# Patient Record
Sex: Male | Born: 1945 | Race: White | Smoking: Current every day smoker
Health system: Southern US, Community
[De-identification: ages and names within clinical notes are randomized; demographics above are authoritative.]

## PROBLEM LIST (undated history)

## (undated) DIAGNOSIS — L720 Epidermal cyst: Secondary | ICD-10-CM

## (undated) DIAGNOSIS — I10 Essential (primary) hypertension: Secondary | ICD-10-CM

## (undated) DIAGNOSIS — E039 Hypothyroidism, unspecified: Secondary | ICD-10-CM

## (undated) DIAGNOSIS — M1990 Unspecified osteoarthritis, unspecified site: Secondary | ICD-10-CM

## (undated) HISTORY — PX: TONSILLECTOMY: SUR1361

---

## 2011-02-23 ENCOUNTER — Other Ambulatory Visit: Payer: Self-pay | Admitting: Family Medicine

## 2011-02-23 DIAGNOSIS — G459 Transient cerebral ischemic attack, unspecified: Secondary | ICD-10-CM

## 2011-03-02 ENCOUNTER — Ambulatory Visit
Admission: RE | Admit: 2011-03-02 | Discharge: 2011-03-02 | Disposition: A | Discharge status: Home / Self Care | Payer: BC Managed Care – PPO | Source: Ambulatory Visit | Attending: Family Medicine | Admitting: Family Medicine

## 2011-03-02 ENCOUNTER — Other Ambulatory Visit: Payer: Self-pay

## 2011-03-02 DIAGNOSIS — G459 Transient cerebral ischemic attack, unspecified: Secondary | ICD-10-CM

## 2011-03-13 ENCOUNTER — Other Ambulatory Visit: Payer: Self-pay | Admitting: Neurology

## 2011-03-13 DIAGNOSIS — G459 Transient cerebral ischemic attack, unspecified: Secondary | ICD-10-CM

## 2011-03-16 ENCOUNTER — Ambulatory Visit
Admission: RE | Admit: 2011-03-16 | Discharge: 2011-03-16 | Disposition: A | Discharge status: Home / Self Care | Payer: BC Managed Care – PPO | Source: Ambulatory Visit | Attending: Neurology | Admitting: Neurology

## 2011-03-16 ENCOUNTER — Inpatient Hospital Stay: Admission: RE | Admit: 2011-03-16 | Payer: BC Managed Care – PPO | Source: Ambulatory Visit

## 2011-03-16 DIAGNOSIS — G459 Transient cerebral ischemic attack, unspecified: Secondary | ICD-10-CM

## 2013-07-21 DIAGNOSIS — I10 Essential (primary) hypertension: Secondary | ICD-10-CM | POA: Diagnosis not present

## 2013-08-30 ENCOUNTER — Other Ambulatory Visit: Payer: Self-pay | Admitting: Family Medicine

## 2013-08-30 DIAGNOSIS — Z125 Encounter for screening for malignant neoplasm of prostate: Secondary | ICD-10-CM | POA: Diagnosis not present

## 2013-08-30 DIAGNOSIS — Z0184 Encounter for antibody response examination: Secondary | ICD-10-CM | POA: Diagnosis not present

## 2013-08-30 DIAGNOSIS — Z23 Encounter for immunization: Secondary | ICD-10-CM | POA: Diagnosis not present

## 2013-08-30 DIAGNOSIS — F17200 Nicotine dependence, unspecified, uncomplicated: Secondary | ICD-10-CM

## 2013-08-30 DIAGNOSIS — IMO0001 Reserved for inherently not codable concepts without codable children: Secondary | ICD-10-CM

## 2013-08-30 DIAGNOSIS — I10 Essential (primary) hypertension: Secondary | ICD-10-CM | POA: Diagnosis not present

## 2013-08-30 DIAGNOSIS — R61 Generalized hyperhidrosis: Secondary | ICD-10-CM | POA: Diagnosis not present

## 2013-08-30 DIAGNOSIS — Z0000 Encounter for general adult medical examination without abnormal findings: Secondary | ICD-10-CM | POA: Diagnosis not present

## 2013-09-07 ENCOUNTER — Ambulatory Visit
Admission: RE | Admit: 2013-09-07 | Discharge: 2013-09-07 | Disposition: A | Discharge status: Home / Self Care | Payer: Medicare Other | Source: Ambulatory Visit | Attending: Family Medicine | Admitting: Family Medicine

## 2013-09-07 DIAGNOSIS — F17200 Nicotine dependence, unspecified, uncomplicated: Secondary | ICD-10-CM

## 2013-09-07 DIAGNOSIS — IMO0001 Reserved for inherently not codable concepts without codable children: Secondary | ICD-10-CM

## 2013-09-07 DIAGNOSIS — Z136 Encounter for screening for cardiovascular disorders: Secondary | ICD-10-CM | POA: Diagnosis not present

## 2013-10-12 DIAGNOSIS — I10 Essential (primary) hypertension: Secondary | ICD-10-CM | POA: Diagnosis not present

## 2013-10-12 DIAGNOSIS — IMO0002 Reserved for concepts with insufficient information to code with codable children: Secondary | ICD-10-CM | POA: Diagnosis not present

## 2013-12-18 DIAGNOSIS — D128 Benign neoplasm of rectum: Secondary | ICD-10-CM | POA: Diagnosis not present

## 2013-12-18 DIAGNOSIS — D126 Benign neoplasm of colon, unspecified: Secondary | ICD-10-CM | POA: Diagnosis not present

## 2013-12-18 DIAGNOSIS — D129 Benign neoplasm of anus and anal canal: Secondary | ICD-10-CM | POA: Diagnosis not present

## 2013-12-18 DIAGNOSIS — K5730 Diverticulosis of large intestine without perforation or abscess without bleeding: Secondary | ICD-10-CM | POA: Diagnosis not present

## 2013-12-18 DIAGNOSIS — Z1211 Encounter for screening for malignant neoplasm of colon: Secondary | ICD-10-CM | POA: Diagnosis not present

## 2013-12-18 DIAGNOSIS — K648 Other hemorrhoids: Secondary | ICD-10-CM | POA: Diagnosis not present

## 2014-02-13 DIAGNOSIS — I10 Essential (primary) hypertension: Secondary | ICD-10-CM | POA: Diagnosis not present

## 2014-05-25 DIAGNOSIS — Z23 Encounter for immunization: Secondary | ICD-10-CM | POA: Diagnosis not present

## 2014-09-03 DIAGNOSIS — I10 Essential (primary) hypertension: Secondary | ICD-10-CM | POA: Diagnosis not present

## 2014-09-03 DIAGNOSIS — Z125 Encounter for screening for malignant neoplasm of prostate: Secondary | ICD-10-CM | POA: Diagnosis not present

## 2014-09-03 DIAGNOSIS — Z23 Encounter for immunization: Secondary | ICD-10-CM | POA: Diagnosis not present

## 2014-09-03 DIAGNOSIS — Z0000 Encounter for general adult medical examination without abnormal findings: Secondary | ICD-10-CM | POA: Diagnosis not present

## 2014-11-27 DIAGNOSIS — M170 Bilateral primary osteoarthritis of knee: Secondary | ICD-10-CM | POA: Diagnosis not present

## 2014-11-27 DIAGNOSIS — M1711 Unilateral primary osteoarthritis, right knee: Secondary | ICD-10-CM | POA: Diagnosis not present

## 2015-02-07 DIAGNOSIS — M79604 Pain in right leg: Secondary | ICD-10-CM | POA: Diagnosis not present

## 2015-02-07 DIAGNOSIS — I83891 Varicose veins of right lower extremities with other complications: Secondary | ICD-10-CM | POA: Diagnosis not present

## 2015-02-07 DIAGNOSIS — I83811 Varicose veins of right lower extremities with pain: Secondary | ICD-10-CM | POA: Diagnosis not present

## 2015-02-14 DIAGNOSIS — I8312 Varicose veins of left lower extremity with inflammation: Secondary | ICD-10-CM | POA: Diagnosis not present

## 2015-02-14 DIAGNOSIS — I8311 Varicose veins of right lower extremity with inflammation: Secondary | ICD-10-CM | POA: Diagnosis not present

## 2015-02-21 DIAGNOSIS — I8311 Varicose veins of right lower extremity with inflammation: Secondary | ICD-10-CM | POA: Diagnosis not present

## 2015-02-21 DIAGNOSIS — I8312 Varicose veins of left lower extremity with inflammation: Secondary | ICD-10-CM | POA: Diagnosis not present

## 2015-02-21 DIAGNOSIS — I83813 Varicose veins of bilateral lower extremities with pain: Secondary | ICD-10-CM | POA: Diagnosis not present

## 2015-03-04 DIAGNOSIS — R202 Paresthesia of skin: Secondary | ICD-10-CM | POA: Diagnosis not present

## 2015-03-08 DIAGNOSIS — L089 Local infection of the skin and subcutaneous tissue, unspecified: Secondary | ICD-10-CM | POA: Diagnosis not present

## 2015-03-08 DIAGNOSIS — L723 Sebaceous cyst: Secondary | ICD-10-CM | POA: Diagnosis not present

## 2015-03-14 DIAGNOSIS — R202 Paresthesia of skin: Secondary | ICD-10-CM | POA: Diagnosis not present

## 2015-03-26 ENCOUNTER — Ambulatory Visit: Payer: Self-pay | Admitting: Surgery

## 2015-03-26 DIAGNOSIS — L089 Local infection of the skin and subcutaneous tissue, unspecified: Secondary | ICD-10-CM | POA: Diagnosis not present

## 2015-03-26 DIAGNOSIS — L723 Sebaceous cyst: Secondary | ICD-10-CM | POA: Diagnosis not present

## 2015-04-01 ENCOUNTER — Ambulatory Visit (INDEPENDENT_AMBULATORY_CARE_PROVIDER_SITE_OTHER): Payer: Self-pay | Admitting: Neurology

## 2015-04-01 ENCOUNTER — Ambulatory Visit (INDEPENDENT_AMBULATORY_CARE_PROVIDER_SITE_OTHER): Payer: Medicare Other | Admitting: Neurology

## 2015-04-01 DIAGNOSIS — R202 Paresthesia of skin: Secondary | ICD-10-CM

## 2015-04-01 DIAGNOSIS — Z0289 Encounter for other administrative examinations: Secondary | ICD-10-CM

## 2015-04-01 NOTE — Progress Notes (Signed)
See procedure note.

## 2015-04-01 NOTE — Procedures (Signed)
[facility]    Provider:  Dr Jaynee Eagles Referring Provider: Vernie Shanks, MD Primary Care Physician:  Anthoney Harada, MD  HPI:  Jack Thompson is a 69 y.o. male here as a referral from Dr. Jacelyn Grip for paresthesias. For 6-8 months, numbness in the soles of both feet with tingling, symmetric. No back pain or radicular symptoms. Recently found out he had hypothyroidism.   Summary: Nerve conduction studies were performed on the bilateral lower extremities.  Bilateral Peroneal and Tibial motor conductions were within normal limits with normal F Wave latencies.  Bilateral Sural and Peroneal sensory conductions were within normal limits Bilateral H Reflex latencies were within normal limits.    EMG needle study was performed on selected right lower extremity muscles. The Vastus Medialis, Anterior Tibialis, Medial Gastrocnemius, Extensor Hallucis Longus and Abductor Hallucis muscles were within normal limits.  Conclusion:  Normal study.  There is no electrophysiologic evidence of polyneuropathy to explain patient's symptoms however a small fiber neuropathy could be responsible for such symptoms and still evade detection by this exam.  [name], MD  [facility] [address] [place], [place] [address]  Phone [phone] Fax [phone]

## 2015-04-01 NOTE — Progress Notes (Signed)
  [facility]    Provider:  Dr Jaynee Eagles Referring Provider: Vernie Shanks, MD Primary Care Physician:  Anthoney Harada, MD  HPI:  Jack Thompson is a 69 y.o. male here as a referral from Dr. Jacelyn Grip for paresthesias. For 6-8 months, numbness in the soles of both feet with tingling, symmetric. No back pain or radicular symptoms. Recently found out he had hypothyroidism.   Summary: Nerve conduction studies were performed on the bilateral lower extremities.  Bilateral Peroneal and Tibial motor conductions were within normal limits with normal F Wave latencies.  Bilateral Sural and Peroneal sensory conductions were within normal limits Bilateral H Reflex latencies were within normal limits.    EMG needle study was performed on selected right lower extremity muscles. The Vastus Medialis, Anterior Tibialis, Medial Gastrocnemius, Extensor Hallucis Longus and Abductor Hallucis muscles were within normal limits.  Conclusion:  Normal study.  There is no electrophysiologic evidence of polyneuropathy to explain patient's symptoms however a small fiber neuropathy could be responsible for such symptoms and still evade detection by this exam.  [name], MD  [facility] [address] [place], [place] [address]  Phone [phone] Fax [phone]

## 2015-04-18 DIAGNOSIS — E038 Other specified hypothyroidism: Secondary | ICD-10-CM | POA: Diagnosis not present

## 2015-04-25 DIAGNOSIS — E038 Other specified hypothyroidism: Secondary | ICD-10-CM | POA: Diagnosis not present

## 2015-04-25 DIAGNOSIS — E063 Autoimmune thyroiditis: Secondary | ICD-10-CM | POA: Diagnosis not present

## 2015-04-26 ENCOUNTER — Encounter (HOSPITAL_BASED_OUTPATIENT_CLINIC_OR_DEPARTMENT_OTHER): Payer: Self-pay | Admitting: *Deleted

## 2015-04-30 ENCOUNTER — Encounter (HOSPITAL_BASED_OUTPATIENT_CLINIC_OR_DEPARTMENT_OTHER)
Admission: RE | Admit: 2015-04-30 | Discharge: 2015-04-30 | Disposition: A | Discharge status: Home / Self Care | Payer: Medicare Other | Source: Ambulatory Visit | Attending: General Surgery | Admitting: General Surgery

## 2015-04-30 DIAGNOSIS — L720 Epidermal cyst: Secondary | ICD-10-CM | POA: Diagnosis not present

## 2015-04-30 DIAGNOSIS — L02214 Cutaneous abscess of groin: Secondary | ICD-10-CM | POA: Diagnosis not present

## 2015-04-30 DIAGNOSIS — Z87891 Personal history of nicotine dependence: Secondary | ICD-10-CM | POA: Diagnosis not present

## 2015-04-30 DIAGNOSIS — I10 Essential (primary) hypertension: Secondary | ICD-10-CM | POA: Diagnosis not present

## 2015-04-30 DIAGNOSIS — L02415 Cutaneous abscess of right lower limb: Secondary | ICD-10-CM | POA: Diagnosis not present

## 2015-04-30 NOTE — Progress Notes (Signed)
Clear ekg by dr Conrad [place]

## 2015-05-01 ENCOUNTER — Encounter (HOSPITAL_BASED_OUTPATIENT_CLINIC_OR_DEPARTMENT_OTHER): Payer: Self-pay | Admitting: Surgery

## 2015-05-01 DIAGNOSIS — L723 Sebaceous cyst: Secondary | ICD-10-CM | POA: Diagnosis present

## 2015-05-01 NOTE — H&P (Signed)
General Surgery [facility]  Jack Thompson DOB: [birth year] Married / Language: English / Race: White Male   History of Present Illness Patient words: seb cyst/ infected groin.  The patient is a 69 year old male who presents with a subcutaneous abscess.  Patient is referred by Dr. Ulanda Edison for evaluation of subcutaneous abscess and multiple sebaceous cyst. Patient has had a lifetime of infected cutaneous lesions requiring surgical attention. Patient now has a quiescent sebaceous cyst on the upper back, a healing lesion on the right medial thigh, and an infected and draining lesion on the left groin region. Patient had seen his primary physician earlier this week and is started on Bactrim DS twice daily. He presents today for surgical assessment and recommendations for management.   Other Problems Arthritis High blood pressure  Past Surgical History Knee Surgery Right. Tonsillectomy Vasectomy  Diagnostic Studies History Colonoscopy 1-5 years ago  Allergies No Known Drug Allergies09/08/2014  Medication History Meloxicam ('15MG'$  Tablet, Oral) Active. Norvasc ('5MG'$  Tablet, Oral DAily) Active. Medications Reconciled  Social History Alcohol use Occasional alcohol use. Caffeine use Coffee. No drug use Tobacco use Former smoker.  Family History Arthritis Father, Mother. Heart Disease Mother.  Review of Systems General Not Present- Appetite Loss, Chills, Fatigue, Fever, Night Sweats, Weight Gain and Weight Loss. Skin Present- New Lesions. Not Present- Change in Wart/Mole, Dryness, Hives, Jaundice, Non-Healing Wounds, Rash and Ulcer. HEENT Not Present- Earache, Hearing Loss, Hoarseness, Nose Bleed, Oral Ulcers, Ringing in the Ears, Seasonal Allergies, Sinus Pain, Sore Throat, Visual Disturbances, Wears glasses/contact lenses and Yellow Eyes. Respiratory Not Present- Bloody sputum, Chronic Cough, Difficulty Breathing, Snoring and  Wheezing. Breast Not Present- Breast Mass, Breast Pain, Nipple Discharge and Skin Changes. Cardiovascular Not Present- Chest Pain, Difficulty Breathing Lying Down, Leg Cramps, Palpitations, Rapid Heart Rate, Shortness of Breath and Swelling of Extremities. Gastrointestinal Not Present- Abdominal Pain, Bloating, Bloody Stool, Change in Bowel Habits, Chronic diarrhea, Constipation, Difficulty Swallowing, Excessive gas, Gets full quickly at meals, Hemorrhoids, Indigestion, Nausea, Rectal Pain and Vomiting. Male Genitourinary Not Present- Blood in Urine, Change in Urinary Stream, Frequency, Impotence, Nocturia, Painful Urination, Urgency and Urine Leakage. Musculoskeletal Not Present- Back Pain, Joint Pain, Joint Stiffness, Muscle Pain, Muscle Weakness and Swelling of Extremities. Neurological Present- Numbness and Tingling. Not Present- Decreased Memory, Fainting, Headaches, Seizures, Tremor, Trouble walking and Weakness. Psychiatric Not Present- Anxiety, Bipolar, Change in Sleep Pattern, Depression, Fearful and Frequent crying. Endocrine Not Present- Cold Intolerance, Excessive Hunger, Hair Changes, Heat Intolerance, Hot flashes and New Diabetes. Hematology Present- Easy Bruising. Not Present- Excessive bleeding, Gland problems, HIV and Persistent Infections.  Vitals Weight: 227 lb Height: 72in Body Surface Area: 2.29 m Body Mass Index: 30.79 kg/m  Temp.: 33F(Temporal)  Pulse: 68 (Regular)  BP: 128/78 (Sitting, Left Arm, Standard)   Physical Exam  General - appears comfortable, no distress; not diaphorectic  HEENT - normocephalic; sclerae clear, gaze conjugate; mucous membranes moist, dentition good; voice normal  Neck - symmetric on extension; no palpable anterior or posterior cervical adenopathy; no palpable masses in the thyroid bed  Chest - clear bilaterally without rhonchi, rales, or wheeze  Cor - regular rhythm with normal rate; no significant murmur  Skin - in the  upper back just to the right of midline is a 1.5 cm sebaceous cyst without signs of infection or inflammation; on the right medial thigh is a violaceous-appearing lesion with a central sinus without fluctuance and only minimal tenderness consistent with a healing abscess; on the  posterior medial left thigh there is an open wound with a 5 mm punctate defect draining per usual and fluid and necrotic debris which is redressed with gauze bandages  Ext - non-tender without significant edema or lymphedema  Neuro - grossly intact; no tremor   Assessment & Plan  SEBACEOUS CYST (L72.3) INFLAMED SEBACEOUS CYST (L72.3) INFECTED SEBACEOUS CYST (L72.3)  Patient presents with a history of multiple sebaceous cysts and abscesses. He currently has a sebaceous cyst on the upper back which is not inflamed or infected. He has a second lesion on the medial right thigh which has recently drained and infection appears to be resolving. There is a third lesion on the posterior medial left thigh which is actively infected, opened, and draining.  At this point the patient does not require incision and drainage. He is taking Bactrim DS twice daily on prescription from his primary care physician.  Patient will begin tub soaks twice daily and cleansing of the skin with antibacterial soap. I've asked him to keep a dressing over the draining wound on the left thigh. This should resolve now that it is open, draining, and the patient is on antibiotics.  Patient will return in 3 weeks to assess his progress. If these lesions have become quiescent, we will consider scheduling him for definitive surgical excision as an outpatient procedure.  Earnstine Regal, MD, [facility] Office: [phone]

## 2015-05-02 ENCOUNTER — Ambulatory Visit (HOSPITAL_BASED_OUTPATIENT_CLINIC_OR_DEPARTMENT_OTHER): Payer: Medicare Other | Admitting: Anesthesiology

## 2015-05-02 ENCOUNTER — Encounter (HOSPITAL_BASED_OUTPATIENT_CLINIC_OR_DEPARTMENT_OTHER)
Admission: RE | Disposition: A | Discharge status: Home / Self Care | Payer: Self-pay | Source: Ambulatory Visit | Attending: Surgery

## 2015-05-02 ENCOUNTER — Ambulatory Visit (HOSPITAL_BASED_OUTPATIENT_CLINIC_OR_DEPARTMENT_OTHER)
Admission: RE | Admit: 2015-05-02 | Discharge: 2015-05-02 | Disposition: A | Discharge status: Home / Self Care | Payer: Medicare Other | Source: Ambulatory Visit | Attending: Surgery | Admitting: Surgery

## 2015-05-02 ENCOUNTER — Encounter (HOSPITAL_BASED_OUTPATIENT_CLINIC_OR_DEPARTMENT_OTHER): Payer: Self-pay | Admitting: Anesthesiology

## 2015-05-02 DIAGNOSIS — L729 Follicular cyst of the skin and subcutaneous tissue, unspecified: Secondary | ICD-10-CM | POA: Diagnosis not present

## 2015-05-02 DIAGNOSIS — I10 Essential (primary) hypertension: Secondary | ICD-10-CM | POA: Insufficient documentation

## 2015-05-02 DIAGNOSIS — L728 Other follicular cysts of the skin and subcutaneous tissue: Secondary | ICD-10-CM | POA: Diagnosis not present

## 2015-05-02 DIAGNOSIS — L02214 Cutaneous abscess of groin: Secondary | ICD-10-CM | POA: Insufficient documentation

## 2015-05-02 DIAGNOSIS — L02415 Cutaneous abscess of right lower limb: Secondary | ICD-10-CM | POA: Insufficient documentation

## 2015-05-02 DIAGNOSIS — D1724 Benign lipomatous neoplasm of skin and subcutaneous tissue of left leg: Secondary | ICD-10-CM | POA: Diagnosis not present

## 2015-05-02 DIAGNOSIS — L723 Sebaceous cyst: Secondary | ICD-10-CM | POA: Diagnosis present

## 2015-05-02 DIAGNOSIS — L720 Epidermal cyst: Secondary | ICD-10-CM | POA: Insufficient documentation

## 2015-05-02 DIAGNOSIS — Z87891 Personal history of nicotine dependence: Secondary | ICD-10-CM | POA: Diagnosis not present

## 2015-05-02 DIAGNOSIS — L089 Local infection of the skin and subcutaneous tissue, unspecified: Secondary | ICD-10-CM | POA: Diagnosis not present

## 2015-05-02 HISTORY — DX: Essential (primary) hypertension: I10

## 2015-05-02 HISTORY — PX: CYST EXCISION: SHX5701

## 2015-05-02 HISTORY — DX: Hypothyroidism, unspecified: E03.9

## 2015-05-02 HISTORY — DX: Epidermal cyst: L72.0

## 2015-05-02 HISTORY — DX: Unspecified osteoarthritis, unspecified site: M19.90

## 2015-05-02 SURGERY — CYST REMOVAL
Anesthesia: General

## 2015-05-02 MED ORDER — CEFAZOLIN SODIUM-DEXTROSE 2-3 GM-% IV SOLR
INTRAVENOUS | Status: AC
  Filled 2015-05-02: qty 50

## 2015-05-02 MED ORDER — SCOPOLAMINE 1 MG/3DAYS TD PT72: 1.0000 | MEDICATED_PATCH | Freq: Once | TRANSDERMAL | Status: DC | PRN

## 2015-05-02 MED ORDER — BUPIVACAINE-EPINEPHRINE (PF) 0.5% -1:200000 IJ SOLN
INTRAMUSCULAR | Status: AC
  Filled 2015-05-02: qty 30

## 2015-05-02 MED ORDER — LACTATED RINGERS IV SOLN
INTRAVENOUS | Status: DC
  Administered 2015-05-02 (×2): via INTRAVENOUS

## 2015-05-02 MED ORDER — LIDOCAINE HCL (CARDIAC) 20 MG/ML IV SOLN
INTRAVENOUS | Status: DC | PRN
  Administered 2015-05-02: 80 mg via INTRAVENOUS

## 2015-05-02 MED ORDER — HYDROCODONE-ACETAMINOPHEN 5-325 MG PO TABS: 1.0000 | ORAL_TABLET | ORAL | Status: DC | PRN

## 2015-05-02 MED ORDER — HYDROMORPHONE HCL 1 MG/ML IJ SOLN
0.2500 mg | INTRAMUSCULAR | Status: DC | PRN
Start: 2015-05-02 — End: 2015-05-02

## 2015-05-02 MED ORDER — ONDANSETRON HCL 4 MG/2ML IJ SOLN
INTRAMUSCULAR | Status: DC | PRN
  Administered 2015-05-02: 4 mg via INTRAVENOUS

## 2015-05-02 MED ORDER — OXYCODONE HCL 5 MG/5ML PO SOLN: 5.0000 mg | Freq: Once | ORAL | Status: DC | PRN

## 2015-05-02 MED ORDER — LIDOCAINE HCL (CARDIAC) 20 MG/ML IV SOLN
INTRAVENOUS | Status: AC
  Filled 2015-05-02: qty 5

## 2015-05-02 MED ORDER — FENTANYL CITRATE (PF) 100 MCG/2ML IJ SOLN
INTRAMUSCULAR | Status: AC
  Filled 2015-05-02: qty 4

## 2015-05-02 MED ORDER — DEXAMETHASONE SODIUM PHOSPHATE 4 MG/ML IJ SOLN
INTRAMUSCULAR | Status: DC | PRN
  Administered 2015-05-02: 10 mg via INTRAVENOUS

## 2015-05-02 MED ORDER — MIDAZOLAM HCL 2 MG/2ML IJ SOLN
1.0000 mg | INTRAMUSCULAR | Status: DC | PRN
  Administered 2015-05-02: 2 mg via INTRAVENOUS

## 2015-05-02 MED ORDER — MEPERIDINE HCL 25 MG/ML IJ SOLN: 6.2500 mg | INTRAMUSCULAR | Status: DC | PRN

## 2015-05-02 MED ORDER — GLYCOPYRROLATE 0.2 MG/ML IJ SOLN: 0.2000 mg | Freq: Once | INTRAMUSCULAR | Status: DC | PRN

## 2015-05-02 MED ORDER — PROPOFOL 10 MG/ML IV BOLUS
INTRAVENOUS | Status: AC
  Filled 2015-05-02: qty 20

## 2015-05-02 MED ORDER — BUPIVACAINE HCL (PF) 0.5 % IJ SOLN
INTRAMUSCULAR | Status: DC | PRN
  Administered 2015-05-02: 10 mL

## 2015-05-02 MED ORDER — DEXAMETHASONE SODIUM PHOSPHATE 10 MG/ML IJ SOLN
INTRAMUSCULAR | Status: AC
  Filled 2015-05-02: qty 1

## 2015-05-02 MED ORDER — OXYCODONE HCL 5 MG PO TABS: 5.0000 mg | ORAL_TABLET | Freq: Once | ORAL | Status: DC | PRN

## 2015-05-02 MED ORDER — MIDAZOLAM HCL 2 MG/2ML IJ SOLN
INTRAMUSCULAR | Status: AC
  Filled 2015-05-02: qty 4

## 2015-05-02 MED ORDER — FENTANYL CITRATE (PF) 100 MCG/2ML IJ SOLN
50.0000 ug | INTRAMUSCULAR | Status: AC | PRN
  Administered 2015-05-02 (×2): 50 ug via INTRAVENOUS
  Administered 2015-05-02: 100 ug via INTRAVENOUS

## 2015-05-02 MED ORDER — ONDANSETRON HCL 4 MG/2ML IJ SOLN
INTRAMUSCULAR | Status: AC
  Filled 2015-05-02: qty 2

## 2015-05-02 MED ORDER — PROPOFOL 10 MG/ML IV BOLUS
INTRAVENOUS | Status: DC | PRN
  Administered 2015-05-02: 200 mg via INTRAVENOUS

## 2015-05-02 MED ORDER — CEFAZOLIN SODIUM-DEXTROSE 2-3 GM-% IV SOLR
2.0000 g | INTRAVENOUS | Status: AC
  Administered 2015-05-02: 2 g via INTRAVENOUS

## 2015-05-02 SURGICAL SUPPLY — 43 items
BENZOIN TINCTURE PRP APPL 2/3 (GAUZE/BANDAGES/DRESSINGS) ×4 IMPLANT
BLADE SURG 15 STRL LF DISP TIS (BLADE) ×1 IMPLANT
BLADE SURG 15 STRL SS (BLADE) ×1
BNDG COHESIVE 4X5 TAN STRL (GAUZE/BANDAGES/DRESSINGS) IMPLANT
BNDG GAUZE ELAST 4 BULKY (GAUZE/BANDAGES/DRESSINGS) IMPLANT
CHLORAPREP W/TINT 26ML (MISCELLANEOUS) ×2 IMPLANT
CLEANER CAUTERY TIP 5X5 PAD (MISCELLANEOUS) IMPLANT
COVER BACK TABLE 60X90IN (DRAPES) IMPLANT
COVER MAYO STAND STRL (DRAPES) ×2 IMPLANT
DECANTER SPIKE VIAL GLASS SM (MISCELLANEOUS) IMPLANT
DRAPE EXTREMITY T 121X128X90 (DRAPE) IMPLANT
DRAPE LAPAROTOMY 100X72 PEDS (DRAPES) IMPLANT
DRAPE U-SHAPE 76X120 STRL (DRAPES) IMPLANT
DRAPE UTILITY XL STRL (DRAPES) ×4 IMPLANT
DRSG TEGADERM 4X4.75 (GAUZE/BANDAGES/DRESSINGS) IMPLANT
ELECT REM PT RETURN 9FT ADLT (ELECTROSURGICAL) ×2
ELECTRODE REM PT RTRN 9FT ADLT (ELECTROSURGICAL) ×1 IMPLANT
GLOVE BIO SURGEON STRL SZ 6.5 (GLOVE) ×2 IMPLANT
GLOVE BIOGEL PI IND STRL 7.0 (GLOVE) ×1 IMPLANT
GLOVE BIOGEL PI INDICATOR 7.0 (GLOVE) ×1
GLOVE SURG ORTHO 8.0 STRL STRW (GLOVE) ×2 IMPLANT
GOWN STRL REUS W/ TWL LRG LVL3 (GOWN DISPOSABLE) ×1 IMPLANT
GOWN STRL REUS W/ TWL XL LVL3 (GOWN DISPOSABLE) ×1 IMPLANT
GOWN STRL REUS W/TWL LRG LVL3 (GOWN DISPOSABLE) ×1
GOWN STRL REUS W/TWL XL LVL3 (GOWN DISPOSABLE) ×1
NEEDLE HYPO 25X1 1.5 SAFETY (NEEDLE) ×2 IMPLANT
PACK BASIN DAY SURGERY FS (CUSTOM PROCEDURE TRAY) ×2 IMPLANT
PACK LITHOTOMY IV (CUSTOM PROCEDURE TRAY) ×2 IMPLANT
PAD CLEANER CAUTERY TIP 5X5 (MISCELLANEOUS)
PENCIL BUTTON HOLSTER BLD 10FT (ELECTRODE) ×2 IMPLANT
SHEET MEDIUM DRAPE 40X70 STRL (DRAPES) IMPLANT
SPONGE GAUZE 4X4 12PLY STER LF (GAUZE/BANDAGES/DRESSINGS) ×4 IMPLANT
STOCKINETTE 4X48 STRL (DRAPES) IMPLANT
STRIP CLOSURE SKIN 1/2X4 (GAUZE/BANDAGES/DRESSINGS) ×2 IMPLANT
SUT ETHILON 3 0 PS 1 (SUTURE) IMPLANT
SUT MON AB 4-0 PC3 18 (SUTURE) ×6 IMPLANT
SUT VICRYL 3-0 CR8 SH (SUTURE) IMPLANT
SUT VICRYL 4-0 PS2 18IN ABS (SUTURE) IMPLANT
SYR CONTROL 10ML LL (SYRINGE) ×2 IMPLANT
TOWEL OR 17X24 6PK STRL BLUE (TOWEL DISPOSABLE) ×4 IMPLANT
TOWEL OR NON WOVEN STRL DISP B (DISPOSABLE) IMPLANT
TUBE CONNECTING 20X1/4 (TUBING) ×2 IMPLANT
YANKAUER SUCT BULB TIP NO VENT (SUCTIONS) ×2 IMPLANT

## 2015-05-02 NOTE — Brief Op Note (Signed)
05/02/2015  1:05 PM  PATIENT:  Kevin Fenton  69 y.o. male  PRE-OPERATIVE DIAGNOSIS:  Multiple Cysts  POST-OPERATIVE DIAGNOSIS:  Multiple Cysts  PROCEDURE:  Procedure(s): EXCISION OF CYST RIGHT THIGH AND BUTTOCK (N/A)  SURGEON:  Surgeon(s) and Role:    * Armandina Gemma, MD - Primary  ANESTHESIA:   general  EBL:  Total I/O In: 1000 [I.V.:1000] Out: -   BLOOD ADMINISTERED:none  DRAINS: none   LOCAL MEDICATIONS USED:  MARCAINE     SPECIMEN:  Excision  DISPOSITION OF SPECIMEN:  PATHOLOGY  COUNTS:  YES  TOURNIQUET:  * No tourniquets in log *  DICTATION: .Other Dictation: Dictation Number [id number]  PLAN OF CARE: Discharge to home after PACU  PATIENT DISPOSITION:  PACU - hemodynamically stable.   Delay start of Pharmacological VTE agent (>24hrs) due to surgical blood loss or risk of bleeding: yes  Earnstine Regal, MD, [facility] Office: [phone]

## 2015-05-02 NOTE — Anesthesia Postprocedure Evaluation (Signed)
  Anesthesia Post-op Note  Patient: Jack Thompson  Procedure(s) Performed: Procedure(s): EXCISION OF CYST RIGHT THIGH AND BUTTOCK (N/A)  Patient Location: PACU  Anesthesia Type: General   Level of Consciousness: awake, alert  and oriented  Airway and Oxygen Therapy: Patient Spontanous Breathing  Post-op Pain: mild  Post-op Assessment: Post-op Vital signs reviewed  Post-op Vital Signs: Reviewed  Last Vitals:  Filed Vitals:   05/02/15 1415  BP: 159/83  Pulse: 57  Temp: 36.4 C  Resp: 16    Complications: No apparent anesthesia complications

## 2015-05-02 NOTE — Anesthesia Postprocedure Evaluation (Signed)
  Anesthesia Post-op Note  Patient: Jack Thompson  Procedure(s) Performed: Procedure(s): EXCISION OF CYST RIGHT THIGH AND BUTTOCK (N/A)  Patient Location: PACU  Anesthesia Type: General   Level of Consciousness: awake, alert  and oriented  Airway and Oxygen Therapy: Patient Spontanous Breathing  Post-op Pain: mild  Post-op Assessment: Post-op Vital signs reviewed  Post-op Vital Signs: Reviewed  Last Vitals:  Filed Vitals:   05/02/15 1341  BP:   Pulse: 57  Temp:   Resp: 15    Complications: No apparent anesthesia complications

## 2015-05-02 NOTE — Discharge Instructions (Signed)
°  Post Anesthesia Home Care Instructions  Activity: Get plenty of rest for the remainder of the day. A responsible adult should stay with you for 24 hours following the procedure.  For the next 24 hours, DO NOT: -Drive a car -Paediatric nurse -Drink alcoholic beverages -Take any medication unless instructed by your physician -Make any legal decisions or sign important papers.  Meals: Start with liquid foods such as gelatin or soup. Progress to regular foods as tolerated. Avoid greasy, spicy, heavy foods. If nausea and/or vomiting occur, drink only clear liquids until the nausea and/or vomiting subsides. Call your physician if vomiting continues.  Special Instructions/Symptoms: Your throat may feel dry or sore from the anesthesia or the breathing tube placed in your throat during surgery. If this causes discomfort, gargle with warm salt water. The discomfort should disappear within 24 hours.  If you had a scopolamine patch placed behind your ear for the management of post- operative nausea and/or vomiting:  1. The medication in the patch is effective for 72 hours, after which it should be removed.  Wrap patch in a tissue and discard in the trash. Wash hands thoroughly with soap and water. 2. You may remove the patch earlier than 72 hours if you experience unpleasant side effects which may include dry mouth, dizziness or visual disturbances. 3. Avoid touching the patch. Wash your hands with soap and water after contact with the patch.   Call your surgeon if you experience:   1.  Fever over 101.0. 2.  Inability to urinate. 3.  Nausea and/or vomiting. 4.  Extreme swelling or bruising at the surgical site. 5.  Continued bleeding from the incision. 6.  Increased pain, redness or drainage from the incision. 7.  Problems related to your pain medication. 8. Any change in color, movement and/or sensation 9. Any problems and/or concerns

## 2015-05-02 NOTE — Anesthesia Procedure Notes (Signed)
Procedure Name: LMA Insertion Date/Time: 05/02/2015 12:12 PM Performed by: Maryella Shivers Pre-anesthesia Checklist: Patient identified, Emergency Drugs available, Suction available and Patient being monitored Patient Re-evaluated:Patient Re-evaluated prior to inductionOxygen Delivery Method: Circle System Utilized Preoxygenation: Pre-oxygenation with 100% oxygen Intubation Type: IV induction Ventilation: Mask ventilation without difficulty LMA: LMA inserted LMA Size: 5.0 Number of attempts: 1 Airway Equipment and Method: Bite block Placement Confirmation: positive ETCO2 Tube secured with: Tape Dental Injury: Teeth and Oropharynx as per pre-operative assessment

## 2015-05-02 NOTE — Transfer of Care (Signed)
Immediate Anesthesia Transfer of Care Note  Patient: Jack Thompson  Procedure(s) Performed: Procedure(s): EXCISION OF CYST RIGHT THIGH AND BUTTOCK (N/A)  Patient Location: PACU  Anesthesia Type:General  Level of Consciousness: sedated  Airway & Oxygen Therapy: Patient Spontanous Breathing and Patient connected to face mask oxygen  Post-op Assessment: Report given to RN and Post -op Vital signs reviewed and stable  Post vital signs: Reviewed and stable  Last Vitals:  Filed Vitals:   05/02/15 1144  BP: 138/89  Pulse: 67  Temp: 36.3 C  Resp: 20    Complications: No apparent anesthesia complications

## 2015-05-02 NOTE — Interval H&P Note (Signed)
History and Physical Interval Note:  05/02/2015 11:55 AM  Jack Thompson  has presented today for surgery, with the diagnosis of Multiple Cysts.  The various methods of treatment have been discussed with the patient and family. After consideration of risks, benefits and other options for treatment, the patient has consented to    Procedure(s): EXCISION OF CYST RIGHT THIGH AND BUTTOCK (N/A) as a surgical intervention .    The patient's history has been reviewed, patient examined, no change in status, stable for surgery.  I have reviewed the patient's chart and labs.  Questions were answered to the patient's satisfaction.    Earnstine Regal, MD, [facility] Office: [place]

## 2015-05-02 NOTE — Anesthesia Preprocedure Evaluation (Signed)
Anesthesia Evaluation  Patient identified by MRN, date of birth, ID band Patient awake    Reviewed: Allergy & Precautions, NPO status , Patient's Chart, lab work & pertinent test results  Airway Mallampati: I  TM Distance: >3 FB Neck ROM: Full    Dental  (+) Teeth Intact, Dental Advisory Given   Pulmonary Current Smoker,    breath sounds clear to auscultation       Cardiovascular hypertension, Pt. on medications  Rhythm:Regular Rate:Normal     Neuro/Psych    GI/Hepatic   Endo/Other  Hypothyroidism   Renal/GU      Musculoskeletal   Abdominal   Peds  Hematology   Anesthesia Other Findings   Reproductive/Obstetrics                             Anesthesia Physical Anesthesia Plan  ASA: II  Anesthesia Plan: General   Post-op Pain Management:    Induction: Intravenous  Airway Management Planned: LMA  Additional Equipment:   Intra-op Plan:   Post-operative Plan: Extubation in OR  Informed Consent: I have reviewed the patients History and Physical, chart, labs and discussed the procedure including the risks, benefits and alternatives for the proposed anesthesia with the patient or authorized representative who has indicated his/her understanding and acceptance.   Dental advisory given  Plan Discussed with: CRNA, Anesthesiologist and Surgeon  Anesthesia Plan Comments:         Anesthesia Quick Evaluation

## 2015-05-03 ENCOUNTER — Encounter (HOSPITAL_BASED_OUTPATIENT_CLINIC_OR_DEPARTMENT_OTHER): Payer: Self-pay | Admitting: Surgery

## 2015-05-03 NOTE — Op Note (Signed)
NAMEVINH, SACHS NO.:  [id number]  MEDICAL RECORD NO.:  [id number]  LOCATION:                               FACILITY:  [facility]  PHYSICIAN:  Earnstine Regal, MD      DATE OF BIRTH:  May 17, 1946  DATE OF PROCEDURE:  05/02/2015                              OPERATIVE REPORT   PREOPERATIVE DIAGNOSES: 1. Sebaceous cyst, right medial thigh, with history of abscess. 2. Sebaceous cyst, medial left groin, history of abscess. 3. Soft tissue mass, medial left groin.  POSTOPERATIVE DIAGNOSES: 1. Sebaceous cyst, right medial thigh, with history of abscess. 2. Sebaceous cyst, medial left groin, history of abscess. 3. Soft tissue mass, medial left groin.  PROCEDURES: 1. Excision of sebaceous cyst, right medial thigh (1.5 cm). 2. Excision of sebaceous cyst, left medial groin (1.0 cm). 3. Excision of soft tissue mass, left medial thigh (1.5 cm).  SURGEON:  Earnstine Regal, MD, FACS  ANESTHESIA:  General.  ESTIMATED BLOOD LOSS:  Minimal.  PREPARATION:  ChloraPrep.  COMPLICATIONS:  None.  INDICATIONS:  The patient is a 69 year old male who presented to my practice with inflammation and abscess involving sebaceous cyst on the right medial thigh and left groin.  These were treated in the office. They are now ready for definitive surgical excision.  DESCRIPTION OF PROCEDURE:  Procedure was done in OR #3 at the [facility].  The patient was brought to the operating room, placed in supine position on the operating room table.  Following administration of general anesthesia, the patient was placed in lithotomy and prepped and draped in the usual aseptic fashion.  After ascertaining that an adequate level of anesthesia had been achieved, the lesion on the medial right thigh is excised with an elliptical incision using a #15 blade.  Dissection encompasses both of the sinus tracts and the skin and was extended into the subcutaneous tissue so as to include the  entire cyst wall.  Tissue was submitted to Pathology for review. Local anesthetic was infiltrated.  Hemostasis was achieved with the electrocautery.  Skin edges were reapproximated with a running 4-0 Monocryl subcuticular suture.  Benzoin and Steri-Strips were applied.  Next, we excised the sebaceous cyst in the medial left groin.  Again, an elliptical incision was made with a #15 blade and dissection carried into the subcutaneous tissues so as to encompass the entire cyst wall as well as the sinus tract to the skin.  The cyst measures approximately 1 cm in size and is submitted in its entirety to Pathology for review. Skin edges were anesthetized with local anesthetic and then closed with a running 4-0 Monocryl suture.  Next, the soft tissue mass which is in the medial left thigh is excised with an elliptical incision made with a #15 blade.  Dissection was carried in the subcutaneous tissue, so that the entire mass was excised. Hemostasis was achieved with electrocautery.  Local anesthetic was infiltrated circumferentially.  Skin edges were reapproximated with a running 4-0 Monocryl suture.  Both wounds on the left are washed and dried and benzoin and Steri-Strips were applied.  Sterile dressings were applied to all wounds.  The patient was  awakened from anesthesia and brought to the recovery room.  The patient tolerated the procedure well.   Earnstine Regal, MD, [facility] Office: [phone]    TMG/MEDQ  D:  05/02/2015  T:  05/03/2015  Job:  [id number]

## 2015-05-03 NOTE — Progress Notes (Signed)
Quick Note:  Please contact patient and notify of benign pathology results.  Breely Panik M. Kyngston Pickelsimer, MD, FACS [facility] Office: [phone]   ______ 

## 2015-05-14 DIAGNOSIS — I83811 Varicose veins of right lower extremities with pain: Secondary | ICD-10-CM | POA: Diagnosis not present

## 2015-05-14 DIAGNOSIS — I8311 Varicose veins of right lower extremity with inflammation: Secondary | ICD-10-CM | POA: Diagnosis not present

## 2015-06-18 DIAGNOSIS — E038 Other specified hypothyroidism: Secondary | ICD-10-CM | POA: Diagnosis not present

## 2015-06-20 DIAGNOSIS — E038 Other specified hypothyroidism: Secondary | ICD-10-CM | POA: Diagnosis not present

## 2015-06-20 DIAGNOSIS — E063 Autoimmune thyroiditis: Secondary | ICD-10-CM | POA: Diagnosis not present

## 2015-06-20 DIAGNOSIS — Z23 Encounter for immunization: Secondary | ICD-10-CM | POA: Diagnosis not present

## 2015-08-14 DIAGNOSIS — E038 Other specified hypothyroidism: Secondary | ICD-10-CM | POA: Diagnosis not present

## 2015-08-16 DIAGNOSIS — E038 Other specified hypothyroidism: Secondary | ICD-10-CM | POA: Diagnosis not present

## 2015-08-16 DIAGNOSIS — E063 Autoimmune thyroiditis: Secondary | ICD-10-CM | POA: Diagnosis not present

## 2015-10-01 DIAGNOSIS — Z136 Encounter for screening for cardiovascular disorders: Secondary | ICD-10-CM | POA: Diagnosis not present

## 2015-10-01 DIAGNOSIS — E039 Hypothyroidism, unspecified: Secondary | ICD-10-CM | POA: Diagnosis not present

## 2015-10-01 DIAGNOSIS — R635 Abnormal weight gain: Secondary | ICD-10-CM | POA: Diagnosis not present

## 2015-10-01 DIAGNOSIS — Z87891 Personal history of nicotine dependence: Secondary | ICD-10-CM | POA: Diagnosis not present

## 2015-10-01 DIAGNOSIS — Z125 Encounter for screening for malignant neoplasm of prostate: Secondary | ICD-10-CM | POA: Diagnosis not present

## 2015-10-01 DIAGNOSIS — I10 Essential (primary) hypertension: Secondary | ICD-10-CM | POA: Diagnosis not present

## 2015-10-11 DIAGNOSIS — E038 Other specified hypothyroidism: Secondary | ICD-10-CM | POA: Diagnosis not present

## 2015-10-14 DIAGNOSIS — E038 Other specified hypothyroidism: Secondary | ICD-10-CM | POA: Diagnosis not present

## 2015-10-14 DIAGNOSIS — E063 Autoimmune thyroiditis: Secondary | ICD-10-CM | POA: Diagnosis not present

## 2015-11-07 DIAGNOSIS — M25561 Pain in right knee: Secondary | ICD-10-CM | POA: Diagnosis not present

## 2015-11-07 DIAGNOSIS — M25562 Pain in left knee: Secondary | ICD-10-CM | POA: Diagnosis not present

## 2015-11-07 DIAGNOSIS — M170 Bilateral primary osteoarthritis of knee: Secondary | ICD-10-CM | POA: Diagnosis not present

## 2015-11-14 DIAGNOSIS — R262 Difficulty in walking, not elsewhere classified: Secondary | ICD-10-CM | POA: Diagnosis not present

## 2015-11-14 DIAGNOSIS — M170 Bilateral primary osteoarthritis of knee: Secondary | ICD-10-CM | POA: Diagnosis not present

## 2015-11-14 DIAGNOSIS — M25561 Pain in right knee: Secondary | ICD-10-CM | POA: Diagnosis not present

## 2015-11-14 DIAGNOSIS — R2689 Other abnormalities of gait and mobility: Secondary | ICD-10-CM | POA: Diagnosis not present

## 2015-11-14 DIAGNOSIS — M25562 Pain in left knee: Secondary | ICD-10-CM | POA: Diagnosis not present

## 2015-11-20 DIAGNOSIS — M1712 Unilateral primary osteoarthritis, left knee: Secondary | ICD-10-CM | POA: Diagnosis not present

## 2015-11-20 DIAGNOSIS — M25562 Pain in left knee: Secondary | ICD-10-CM | POA: Diagnosis not present

## 2015-11-20 DIAGNOSIS — M25561 Pain in right knee: Secondary | ICD-10-CM | POA: Diagnosis not present

## 2015-11-20 DIAGNOSIS — R2689 Other abnormalities of gait and mobility: Secondary | ICD-10-CM | POA: Diagnosis not present

## 2015-11-20 DIAGNOSIS — M170 Bilateral primary osteoarthritis of knee: Secondary | ICD-10-CM | POA: Diagnosis not present

## 2015-11-20 DIAGNOSIS — M1711 Unilateral primary osteoarthritis, right knee: Secondary | ICD-10-CM | POA: Diagnosis not present

## 2015-11-21 DIAGNOSIS — M25561 Pain in right knee: Secondary | ICD-10-CM | POA: Diagnosis not present

## 2015-11-21 DIAGNOSIS — M170 Bilateral primary osteoarthritis of knee: Secondary | ICD-10-CM | POA: Diagnosis not present

## 2015-11-21 DIAGNOSIS — R2689 Other abnormalities of gait and mobility: Secondary | ICD-10-CM | POA: Diagnosis not present

## 2015-11-21 DIAGNOSIS — M1711 Unilateral primary osteoarthritis, right knee: Secondary | ICD-10-CM | POA: Diagnosis not present

## 2015-11-21 DIAGNOSIS — M25562 Pain in left knee: Secondary | ICD-10-CM | POA: Diagnosis not present

## 2015-11-26 DIAGNOSIS — M25561 Pain in right knee: Secondary | ICD-10-CM | POA: Diagnosis not present

## 2015-11-26 DIAGNOSIS — M25562 Pain in left knee: Secondary | ICD-10-CM | POA: Diagnosis not present

## 2015-11-26 DIAGNOSIS — M1712 Unilateral primary osteoarthritis, left knee: Secondary | ICD-10-CM | POA: Diagnosis not present

## 2015-11-26 DIAGNOSIS — M170 Bilateral primary osteoarthritis of knee: Secondary | ICD-10-CM | POA: Diagnosis not present

## 2015-11-26 DIAGNOSIS — R2689 Other abnormalities of gait and mobility: Secondary | ICD-10-CM | POA: Diagnosis not present

## 2015-11-28 DIAGNOSIS — R2689 Other abnormalities of gait and mobility: Secondary | ICD-10-CM | POA: Diagnosis not present

## 2015-11-28 DIAGNOSIS — M1711 Unilateral primary osteoarthritis, right knee: Secondary | ICD-10-CM | POA: Diagnosis not present

## 2015-11-28 DIAGNOSIS — M170 Bilateral primary osteoarthritis of knee: Secondary | ICD-10-CM | POA: Diagnosis not present

## 2015-11-28 DIAGNOSIS — M25561 Pain in right knee: Secondary | ICD-10-CM | POA: Diagnosis not present

## 2015-11-28 DIAGNOSIS — M25562 Pain in left knee: Secondary | ICD-10-CM | POA: Diagnosis not present

## 2015-12-04 DIAGNOSIS — M25561 Pain in right knee: Secondary | ICD-10-CM | POA: Diagnosis not present

## 2015-12-04 DIAGNOSIS — M170 Bilateral primary osteoarthritis of knee: Secondary | ICD-10-CM | POA: Diagnosis not present

## 2015-12-04 DIAGNOSIS — M1712 Unilateral primary osteoarthritis, left knee: Secondary | ICD-10-CM | POA: Diagnosis not present

## 2015-12-04 DIAGNOSIS — M25562 Pain in left knee: Secondary | ICD-10-CM | POA: Diagnosis not present

## 2015-12-04 DIAGNOSIS — R2689 Other abnormalities of gait and mobility: Secondary | ICD-10-CM | POA: Diagnosis not present

## 2015-12-05 DIAGNOSIS — M25561 Pain in right knee: Secondary | ICD-10-CM | POA: Diagnosis not present

## 2015-12-05 DIAGNOSIS — M170 Bilateral primary osteoarthritis of knee: Secondary | ICD-10-CM | POA: Diagnosis not present

## 2015-12-05 DIAGNOSIS — M25562 Pain in left knee: Secondary | ICD-10-CM | POA: Diagnosis not present

## 2015-12-05 DIAGNOSIS — R2689 Other abnormalities of gait and mobility: Secondary | ICD-10-CM | POA: Diagnosis not present

## 2015-12-05 DIAGNOSIS — M1711 Unilateral primary osteoarthritis, right knee: Secondary | ICD-10-CM | POA: Diagnosis not present

## 2015-12-10 DIAGNOSIS — E038 Other specified hypothyroidism: Secondary | ICD-10-CM | POA: Diagnosis not present

## 2015-12-11 DIAGNOSIS — M25562 Pain in left knee: Secondary | ICD-10-CM | POA: Diagnosis not present

## 2015-12-11 DIAGNOSIS — M170 Bilateral primary osteoarthritis of knee: Secondary | ICD-10-CM | POA: Diagnosis not present

## 2015-12-11 DIAGNOSIS — M25561 Pain in right knee: Secondary | ICD-10-CM | POA: Diagnosis not present

## 2015-12-12 DIAGNOSIS — E038 Other specified hypothyroidism: Secondary | ICD-10-CM | POA: Diagnosis not present

## 2015-12-12 DIAGNOSIS — E063 Autoimmune thyroiditis: Secondary | ICD-10-CM | POA: Diagnosis not present

## 2016-04-03 DIAGNOSIS — R635 Abnormal weight gain: Secondary | ICD-10-CM | POA: Diagnosis not present

## 2016-04-03 DIAGNOSIS — E7800 Pure hypercholesterolemia, unspecified: Secondary | ICD-10-CM | POA: Diagnosis not present

## 2016-04-03 DIAGNOSIS — Z87891 Personal history of nicotine dependence: Secondary | ICD-10-CM | POA: Diagnosis not present

## 2016-04-03 DIAGNOSIS — E039 Hypothyroidism, unspecified: Secondary | ICD-10-CM | POA: Diagnosis not present

## 2016-04-03 DIAGNOSIS — I10 Essential (primary) hypertension: Secondary | ICD-10-CM | POA: Diagnosis not present

## 2016-04-10 DIAGNOSIS — E038 Other specified hypothyroidism: Secondary | ICD-10-CM | POA: Diagnosis not present

## 2016-04-14 DIAGNOSIS — E063 Autoimmune thyroiditis: Secondary | ICD-10-CM | POA: Diagnosis not present

## 2016-04-14 DIAGNOSIS — Z23 Encounter for immunization: Secondary | ICD-10-CM | POA: Diagnosis not present

## 2016-04-14 DIAGNOSIS — E038 Other specified hypothyroidism: Secondary | ICD-10-CM | POA: Diagnosis not present

## 2016-07-21 DIAGNOSIS — I10 Essential (primary) hypertension: Secondary | ICD-10-CM | POA: Diagnosis not present

## 2016-07-21 DIAGNOSIS — R635 Abnormal weight gain: Secondary | ICD-10-CM | POA: Diagnosis not present

## 2016-07-21 DIAGNOSIS — E7800 Pure hypercholesterolemia, unspecified: Secondary | ICD-10-CM | POA: Diagnosis not present

## 2016-07-21 DIAGNOSIS — E039 Hypothyroidism, unspecified: Secondary | ICD-10-CM | POA: Diagnosis not present

## 2016-10-13 DIAGNOSIS — E039 Hypothyroidism, unspecified: Secondary | ICD-10-CM | POA: Diagnosis not present

## 2016-10-13 DIAGNOSIS — E063 Autoimmune thyroiditis: Secondary | ICD-10-CM | POA: Diagnosis not present

## 2016-10-29 DIAGNOSIS — E063 Autoimmune thyroiditis: Secondary | ICD-10-CM | POA: Diagnosis not present

## 2016-10-29 DIAGNOSIS — F17290 Nicotine dependence, other tobacco product, uncomplicated: Secondary | ICD-10-CM | POA: Diagnosis not present

## 2016-10-29 DIAGNOSIS — E038 Other specified hypothyroidism: Secondary | ICD-10-CM | POA: Diagnosis not present

## 2016-12-24 DIAGNOSIS — Z125 Encounter for screening for malignant neoplasm of prostate: Secondary | ICD-10-CM | POA: Diagnosis not present

## 2016-12-24 DIAGNOSIS — R635 Abnormal weight gain: Secondary | ICD-10-CM | POA: Diagnosis not present

## 2016-12-24 DIAGNOSIS — E7800 Pure hypercholesterolemia, unspecified: Secondary | ICD-10-CM | POA: Diagnosis not present

## 2016-12-24 DIAGNOSIS — I10 Essential (primary) hypertension: Secondary | ICD-10-CM | POA: Diagnosis not present

## 2016-12-24 DIAGNOSIS — E039 Hypothyroidism, unspecified: Secondary | ICD-10-CM | POA: Diagnosis not present

## 2017-04-29 DIAGNOSIS — E063 Autoimmune thyroiditis: Secondary | ICD-10-CM | POA: Diagnosis not present

## 2017-04-29 DIAGNOSIS — E038 Other specified hypothyroidism: Secondary | ICD-10-CM | POA: Diagnosis not present

## 2017-05-04 DIAGNOSIS — E063 Autoimmune thyroiditis: Secondary | ICD-10-CM | POA: Diagnosis not present

## 2017-05-04 DIAGNOSIS — Z23 Encounter for immunization: Secondary | ICD-10-CM | POA: Diagnosis not present

## 2017-05-04 DIAGNOSIS — E038 Other specified hypothyroidism: Secondary | ICD-10-CM | POA: Diagnosis not present

## 2017-05-04 DIAGNOSIS — F17210 Nicotine dependence, cigarettes, uncomplicated: Secondary | ICD-10-CM | POA: Diagnosis not present

## 2017-05-24 DIAGNOSIS — Z1211 Encounter for screening for malignant neoplasm of colon: Secondary | ICD-10-CM | POA: Diagnosis not present

## 2017-10-21 DIAGNOSIS — E038 Other specified hypothyroidism: Secondary | ICD-10-CM | POA: Diagnosis not present

## 2017-10-21 DIAGNOSIS — E063 Autoimmune thyroiditis: Secondary | ICD-10-CM | POA: Diagnosis not present

## 2017-11-02 DIAGNOSIS — R69 Illness, unspecified: Secondary | ICD-10-CM | POA: Diagnosis not present

## 2017-11-02 DIAGNOSIS — E063 Autoimmune thyroiditis: Secondary | ICD-10-CM | POA: Diagnosis not present

## 2017-11-02 DIAGNOSIS — E669 Obesity, unspecified: Secondary | ICD-10-CM | POA: Diagnosis not present

## 2017-11-02 DIAGNOSIS — E038 Other specified hypothyroidism: Secondary | ICD-10-CM | POA: Diagnosis not present

## 2017-11-02 DIAGNOSIS — Z6835 Body mass index (BMI) 35.0-35.9, adult: Secondary | ICD-10-CM | POA: Diagnosis not present

## 2017-11-02 DIAGNOSIS — Z79890 Hormone replacement therapy (postmenopausal): Secondary | ICD-10-CM | POA: Diagnosis not present

## 2017-11-08 DIAGNOSIS — G459 Transient cerebral ischemic attack, unspecified: Secondary | ICD-10-CM | POA: Diagnosis not present

## 2017-11-08 DIAGNOSIS — E039 Hypothyroidism, unspecified: Secondary | ICD-10-CM | POA: Diagnosis not present

## 2017-11-08 DIAGNOSIS — I10 Essential (primary) hypertension: Secondary | ICD-10-CM | POA: Diagnosis not present

## 2017-11-15 DIAGNOSIS — R69 Illness, unspecified: Secondary | ICD-10-CM | POA: Diagnosis not present

## 2018-01-14 DIAGNOSIS — E039 Hypothyroidism, unspecified: Secondary | ICD-10-CM | POA: Diagnosis not present

## 2018-01-14 DIAGNOSIS — Z125 Encounter for screening for malignant neoplasm of prostate: Secondary | ICD-10-CM | POA: Diagnosis not present

## 2018-01-14 DIAGNOSIS — G459 Transient cerebral ischemic attack, unspecified: Secondary | ICD-10-CM | POA: Diagnosis not present

## 2018-01-14 DIAGNOSIS — R739 Hyperglycemia, unspecified: Secondary | ICD-10-CM | POA: Diagnosis not present

## 2018-01-14 DIAGNOSIS — I10 Essential (primary) hypertension: Secondary | ICD-10-CM | POA: Diagnosis not present

## 2018-01-14 DIAGNOSIS — E7800 Pure hypercholesterolemia, unspecified: Secondary | ICD-10-CM | POA: Diagnosis not present

## 2018-04-28 DIAGNOSIS — E038 Other specified hypothyroidism: Secondary | ICD-10-CM | POA: Diagnosis not present

## 2018-04-28 DIAGNOSIS — E063 Autoimmune thyroiditis: Secondary | ICD-10-CM | POA: Diagnosis not present

## 2018-05-03 DIAGNOSIS — E063 Autoimmune thyroiditis: Secondary | ICD-10-CM | POA: Diagnosis not present

## 2018-05-03 DIAGNOSIS — Z87891 Personal history of nicotine dependence: Secondary | ICD-10-CM | POA: Diagnosis not present

## 2018-05-03 DIAGNOSIS — Z23 Encounter for immunization: Secondary | ICD-10-CM | POA: Diagnosis not present

## 2018-05-03 DIAGNOSIS — E038 Other specified hypothyroidism: Secondary | ICD-10-CM | POA: Diagnosis not present

## 2018-06-09 DIAGNOSIS — E7800 Pure hypercholesterolemia, unspecified: Secondary | ICD-10-CM | POA: Diagnosis not present

## 2018-06-09 DIAGNOSIS — E039 Hypothyroidism, unspecified: Secondary | ICD-10-CM | POA: Diagnosis not present

## 2018-06-09 DIAGNOSIS — G459 Transient cerebral ischemic attack, unspecified: Secondary | ICD-10-CM | POA: Diagnosis not present

## 2018-06-09 DIAGNOSIS — I10 Essential (primary) hypertension: Secondary | ICD-10-CM | POA: Diagnosis not present

## 2018-12-09 DIAGNOSIS — R69 Illness, unspecified: Secondary | ICD-10-CM | POA: Diagnosis not present

## 2018-12-09 DIAGNOSIS — G459 Transient cerebral ischemic attack, unspecified: Secondary | ICD-10-CM | POA: Diagnosis not present

## 2018-12-09 DIAGNOSIS — I10 Essential (primary) hypertension: Secondary | ICD-10-CM | POA: Diagnosis not present

## 2018-12-09 DIAGNOSIS — E7800 Pure hypercholesterolemia, unspecified: Secondary | ICD-10-CM | POA: Diagnosis not present

## 2018-12-09 DIAGNOSIS — E039 Hypothyroidism, unspecified: Secondary | ICD-10-CM | POA: Diagnosis not present

## 2019-01-04 DIAGNOSIS — E038 Other specified hypothyroidism: Secondary | ICD-10-CM | POA: Diagnosis not present

## 2019-01-04 DIAGNOSIS — E063 Autoimmune thyroiditis: Secondary | ICD-10-CM | POA: Diagnosis not present

## 2019-01-10 DIAGNOSIS — Z87891 Personal history of nicotine dependence: Secondary | ICD-10-CM | POA: Diagnosis not present

## 2019-01-10 DIAGNOSIS — E669 Obesity, unspecified: Secondary | ICD-10-CM | POA: Diagnosis not present

## 2019-01-10 DIAGNOSIS — E063 Autoimmune thyroiditis: Secondary | ICD-10-CM | POA: Diagnosis not present

## 2019-01-10 DIAGNOSIS — E038 Other specified hypothyroidism: Secondary | ICD-10-CM | POA: Diagnosis not present

## 2019-07-10 DIAGNOSIS — E063 Autoimmune thyroiditis: Secondary | ICD-10-CM | POA: Diagnosis not present

## 2019-07-10 DIAGNOSIS — E038 Other specified hypothyroidism: Secondary | ICD-10-CM | POA: Diagnosis not present

## 2019-07-13 DIAGNOSIS — Z87891 Personal history of nicotine dependence: Secondary | ICD-10-CM | POA: Diagnosis not present

## 2019-07-13 DIAGNOSIS — E038 Other specified hypothyroidism: Secondary | ICD-10-CM | POA: Diagnosis not present

## 2019-07-13 DIAGNOSIS — E063 Autoimmune thyroiditis: Secondary | ICD-10-CM | POA: Diagnosis not present

## 2019-08-22 DIAGNOSIS — E7800 Pure hypercholesterolemia, unspecified: Secondary | ICD-10-CM | POA: Diagnosis not present

## 2019-08-22 DIAGNOSIS — E039 Hypothyroidism, unspecified: Secondary | ICD-10-CM | POA: Diagnosis not present

## 2019-08-22 DIAGNOSIS — I10 Essential (primary) hypertension: Secondary | ICD-10-CM | POA: Diagnosis not present

## 2019-08-22 DIAGNOSIS — Z125 Encounter for screening for malignant neoplasm of prostate: Secondary | ICD-10-CM | POA: Diagnosis not present

## 2019-10-25 DIAGNOSIS — R69 Illness, unspecified: Secondary | ICD-10-CM | POA: Diagnosis not present

## 2019-12-11 DIAGNOSIS — J449 Chronic obstructive pulmonary disease, unspecified: Secondary | ICD-10-CM | POA: Diagnosis not present

## 2019-12-11 DIAGNOSIS — M25519 Pain in unspecified shoulder: Secondary | ICD-10-CM | POA: Diagnosis not present

## 2019-12-11 DIAGNOSIS — Z87891 Personal history of nicotine dependence: Secondary | ICD-10-CM | POA: Diagnosis not present

## 2020-01-23 DIAGNOSIS — E063 Autoimmune thyroiditis: Secondary | ICD-10-CM | POA: Diagnosis not present

## 2020-01-23 DIAGNOSIS — E038 Other specified hypothyroidism: Secondary | ICD-10-CM | POA: Diagnosis not present

## 2020-01-24 ENCOUNTER — Other Ambulatory Visit: Payer: Self-pay

## 2020-01-24 ENCOUNTER — Encounter (HOSPITAL_COMMUNITY): Payer: Self-pay | Admitting: Emergency Medicine

## 2020-01-24 ENCOUNTER — Inpatient Hospital Stay (HOSPITAL_COMMUNITY)
Admission: EM | Admit: 2020-01-24 | Discharge: 2020-01-31 | DRG: 167 | Disposition: A | Discharge status: Home / Self Care | Payer: Medicare HMO | Attending: Internal Medicine | Admitting: Internal Medicine

## 2020-01-24 ENCOUNTER — Emergency Department (HOSPITAL_COMMUNITY): Payer: Medicare HMO

## 2020-01-24 DIAGNOSIS — C779 Secondary and unspecified malignant neoplasm of lymph node, unspecified: Secondary | ICD-10-CM | POA: Diagnosis present

## 2020-01-24 DIAGNOSIS — J441 Chronic obstructive pulmonary disease with (acute) exacerbation: Secondary | ICD-10-CM | POA: Diagnosis present

## 2020-01-24 DIAGNOSIS — M1990 Unspecified osteoarthritis, unspecified site: Secondary | ICD-10-CM | POA: Diagnosis present

## 2020-01-24 DIAGNOSIS — F17210 Nicotine dependence, cigarettes, uncomplicated: Secondary | ICD-10-CM | POA: Diagnosis present

## 2020-01-24 DIAGNOSIS — C3490 Malignant neoplasm of unspecified part of unspecified bronchus or lung: Secondary | ICD-10-CM

## 2020-01-24 DIAGNOSIS — C3431 Malignant neoplasm of lower lobe, right bronchus or lung: Secondary | ICD-10-CM | POA: Diagnosis present

## 2020-01-24 DIAGNOSIS — Z79899 Other long term (current) drug therapy: Secondary | ICD-10-CM

## 2020-01-24 DIAGNOSIS — J90 Pleural effusion, not elsewhere classified: Secondary | ICD-10-CM | POA: Diagnosis present

## 2020-01-24 DIAGNOSIS — I119 Hypertensive heart disease without heart failure: Secondary | ICD-10-CM | POA: Diagnosis present

## 2020-01-24 DIAGNOSIS — R0602 Shortness of breath: Secondary | ICD-10-CM | POA: Diagnosis not present

## 2020-01-24 DIAGNOSIS — M899 Disorder of bone, unspecified: Secondary | ICD-10-CM | POA: Diagnosis present

## 2020-01-24 DIAGNOSIS — R0600 Dyspnea, unspecified: Secondary | ICD-10-CM

## 2020-01-24 DIAGNOSIS — R05 Cough: Secondary | ICD-10-CM | POA: Diagnosis present

## 2020-01-24 DIAGNOSIS — R918 Other nonspecific abnormal finding of lung field: Secondary | ICD-10-CM | POA: Diagnosis not present

## 2020-01-24 DIAGNOSIS — Z9889 Other specified postprocedural states: Secondary | ICD-10-CM

## 2020-01-24 DIAGNOSIS — Z79890 Hormone replacement therapy (postmenopausal): Secondary | ICD-10-CM

## 2020-01-24 DIAGNOSIS — Z20822 Contact with and (suspected) exposure to covid-19: Secondary | ICD-10-CM | POA: Diagnosis present

## 2020-01-24 DIAGNOSIS — R059 Cough, unspecified: Secondary | ICD-10-CM | POA: Diagnosis present

## 2020-01-24 DIAGNOSIS — E8809 Other disorders of plasma-protein metabolism, not elsewhere classified: Secondary | ICD-10-CM | POA: Diagnosis present

## 2020-01-24 DIAGNOSIS — D630 Anemia in neoplastic disease: Secondary | ICD-10-CM | POA: Diagnosis present

## 2020-01-24 DIAGNOSIS — Z85118 Personal history of other malignant neoplasm of bronchus and lung: Secondary | ICD-10-CM

## 2020-01-24 DIAGNOSIS — E039 Hypothyroidism, unspecified: Secondary | ICD-10-CM | POA: Diagnosis present

## 2020-01-24 DIAGNOSIS — D72829 Elevated white blood cell count, unspecified: Secondary | ICD-10-CM | POA: Diagnosis present

## 2020-01-24 DIAGNOSIS — I10 Essential (primary) hypertension: Secondary | ICD-10-CM | POA: Diagnosis not present

## 2020-01-24 DIAGNOSIS — J9811 Atelectasis: Secondary | ICD-10-CM | POA: Diagnosis not present

## 2020-01-24 DIAGNOSIS — J9601 Acute respiratory failure with hypoxia: Principal | ICD-10-CM | POA: Diagnosis present

## 2020-01-24 DIAGNOSIS — G4733 Obstructive sleep apnea (adult) (pediatric): Secondary | ICD-10-CM | POA: Diagnosis present

## 2020-01-24 DIAGNOSIS — Z7951 Long term (current) use of inhaled steroids: Secondary | ICD-10-CM

## 2020-01-24 DIAGNOSIS — Z79891 Long term (current) use of opiate analgesic: Secondary | ICD-10-CM

## 2020-01-24 DIAGNOSIS — J910 Malignant pleural effusion: Secondary | ICD-10-CM

## 2020-01-24 DIAGNOSIS — D649 Anemia, unspecified: Secondary | ICD-10-CM | POA: Diagnosis present

## 2020-01-24 DIAGNOSIS — E871 Hypo-osmolality and hyponatremia: Secondary | ICD-10-CM | POA: Diagnosis present

## 2020-01-24 LAB — CBC WITH DIFFERENTIAL/PLATELET
Abs Immature Granulocytes: 0.06 10*3/uL (ref 0.00–0.07)
Basophils Absolute: 0.1 10*3/uL (ref 0.0–0.1)
Basophils Relative: 0 %
Eosinophils Absolute: 0.1 10*3/uL (ref 0.0–0.5)
Eosinophils Relative: 1 %
HCT: 38.8 % — ABNORMAL LOW (ref 39.0–52.0)
Hemoglobin: 12.5 g/dL — ABNORMAL LOW (ref 13.0–17.0)
Immature Granulocytes: 0 %
Lymphocytes Relative: 8 %
Lymphs Abs: 1.1 10*3/uL (ref 0.7–4.0)
MCH: 31.1 pg (ref 26.0–34.0)
MCHC: 32.2 g/dL (ref 30.0–36.0)
MCV: 96.5 fL (ref 80.0–100.0)
Monocytes Absolute: 1.1 10*3/uL — ABNORMAL HIGH (ref 0.1–1.0)
Monocytes Relative: 8 %
Neutro Abs: 12.3 10*3/uL — ABNORMAL HIGH (ref 1.7–7.7)
Neutrophils Relative %: 83 %
Platelets: 317 10*3/uL (ref 150–400)
RBC: 4.02 MIL/uL — ABNORMAL LOW (ref 4.22–5.81)
RDW: 14.9 % (ref 11.5–15.5)
WBC: 14.7 10*3/uL — ABNORMAL HIGH (ref 4.0–10.5)
nRBC: 0 % (ref 0.0–0.2)

## 2020-01-24 LAB — COMPREHENSIVE METABOLIC PANEL WITH GFR
ALT: 20 U/L (ref 0–44)
AST: 22 U/L (ref 15–41)
Albumin: 2.8 g/dL — ABNORMAL LOW (ref 3.5–5.0)
Alkaline Phosphatase: 85 U/L (ref 38–126)
Anion gap: 10 (ref 5–15)
BUN: 13 mg/dL (ref 8–23)
CO2: 27 mmol/L (ref 22–32)
Calcium: 8.5 mg/dL — ABNORMAL LOW (ref 8.9–10.3)
Chloride: 96 mmol/L — ABNORMAL LOW (ref 98–111)
Creatinine, Ser: 0.9 mg/dL (ref 0.61–1.24)
GFR calc Af Amer: >60 mL/min (ref >60)
GFR calc non Af Amer: >60 mL/min (ref >60)
Glucose, Bld: 107 mg/dL — ABNORMAL HIGH (ref 70–99)
Potassium: 3.8 mmol/L (ref 3.5–5.1)
Sodium: 133 mmol/L — ABNORMAL LOW (ref 135–145)
Total Bilirubin: 1.1 mg/dL (ref 0.3–1.2)
Total Protein: 7.8 g/dL (ref 6.5–8.1)

## 2020-01-24 LAB — URINALYSIS, ROUTINE W REFLEX MICROSCOPIC
Bilirubin Urine: NEGATIVE
Glucose, UA: NEGATIVE mg/dL
Hgb urine dipstick: NEGATIVE
Ketones, ur: NEGATIVE mg/dL
Leukocytes,Ua: NEGATIVE
Nitrite: NEGATIVE
Protein, ur: NEGATIVE mg/dL
Specific Gravity, Urine: 1.009 (ref 1.005–1.030)
pH: 6 (ref 5.0–8.0)

## 2020-01-24 LAB — SARS CORONAVIRUS 2 BY RT PCR (HOSPITAL ORDER, PERFORMED IN ~~LOC~~ HOSPITAL LAB): SARS Coronavirus 2: NEGATIVE

## 2020-01-24 LAB — BRAIN NATRIURETIC PEPTIDE: B Natriuretic Peptide: 50.4 pg/mL (ref 0.0–100.0)

## 2020-01-24 MED ORDER — HYDROCOD POLST-CPM POLST ER 10-8 MG/5ML PO SUER
5.0000 mL | Freq: Two times a day (BID) | ORAL | Status: DC | PRN
  Administered 2020-01-25 – 2020-01-28 (×5): 5 mL via ORAL
  Filled 2020-01-24 (×5): qty 5

## 2020-01-24 MED ORDER — IPRATROPIUM-ALBUTEROL 0.5-2.5 (3) MG/3ML IN SOLN: 3.0000 mL | Freq: Four times a day (QID) | RESPIRATORY_TRACT | Status: DC

## 2020-01-24 MED ORDER — IPRATROPIUM-ALBUTEROL 0.5-2.5 (3) MG/3ML IN SOLN
3.0000 mL | Freq: Three times a day (TID) | RESPIRATORY_TRACT | Status: DC
  Administered 2020-01-25 – 2020-01-31 (×19): 3 mL via RESPIRATORY_TRACT
  Filled 2020-01-24 (×18): qty 3

## 2020-01-24 MED ORDER — IPRATROPIUM-ALBUTEROL 0.5-2.5 (3) MG/3ML IN SOLN
3.0000 mL | Freq: Four times a day (QID) | RESPIRATORY_TRACT | Status: DC
  Administered 2020-01-24: 3 mL via RESPIRATORY_TRACT
  Filled 2020-01-24: qty 3

## 2020-01-24 MED ORDER — IOHEXOL 350 MG/ML SOLN
100.0000 mL | Freq: Once | INTRAVENOUS | Status: AC | PRN
  Administered 2020-01-24: 100 mL via INTRAVENOUS

## 2020-01-24 MED ORDER — BUDESONIDE 0.5 MG/2ML IN SUSP
0.5000 mg | Freq: Two times a day (BID) | RESPIRATORY_TRACT | Status: DC
  Administered 2020-01-24 – 2020-01-31 (×14): 0.5 mg via RESPIRATORY_TRACT
  Filled 2020-01-24 (×14): qty 2

## 2020-01-24 MED ORDER — GUAIFENESIN ER 600 MG PO TB12
600.0000 mg | ORAL_TABLET | Freq: Two times a day (BID) | ORAL | Status: DC
  Administered 2020-01-24 – 2020-01-31 (×14): 600 mg via ORAL
  Filled 2020-01-24 (×14): qty 1

## 2020-01-24 MED ORDER — ACETAMINOPHEN 650 MG RE SUPP: 650.0000 mg | Freq: Four times a day (QID) | RECTAL | Status: DC | PRN

## 2020-01-24 MED ORDER — ONDANSETRON HCL 4 MG PO TABS: 4.0000 mg | ORAL_TABLET | Freq: Four times a day (QID) | ORAL | Status: DC | PRN

## 2020-01-24 MED ORDER — ENSURE ENLIVE PO LIQD: 237.0000 mL | Freq: Two times a day (BID) | ORAL | Status: DC

## 2020-01-24 MED ORDER — ONDANSETRON HCL 4 MG/2ML IJ SOLN: 4.0000 mg | Freq: Four times a day (QID) | INTRAMUSCULAR | Status: DC | PRN

## 2020-01-24 MED ORDER — ALPRAZOLAM 0.25 MG PO TABS
0.2500 mg | ORAL_TABLET | Freq: Three times a day (TID) | ORAL | Status: DC | PRN
  Administered 2020-01-25 – 2020-01-27 (×3): 0.25 mg via ORAL
  Filled 2020-01-24 (×3): qty 1

## 2020-01-24 MED ORDER — ACETAMINOPHEN 325 MG PO TABS: 650.0000 mg | ORAL_TABLET | Freq: Four times a day (QID) | ORAL | Status: DC | PRN

## 2020-01-24 NOTE — ED Triage Notes (Signed)
Per EMS, patient c/o worsening SOB x2 months. Reports sleeping in the recliner at night. Hx COPD. Denies chest pain.  92% on RA 97% on 2L [place]

## 2020-01-24 NOTE — ED Notes (Signed)
Pt baseline 92% RA, this nurse placed pt on 2 L nasal cannula for comfort, pt now satting @ 94%

## 2020-01-24 NOTE — H&P (Signed)
History and Physical    Jack Thompson KYH:[id number] DOB: [birth year] DOA: 01/24/2020  PCP: Vernie Shanks, MD   Patient coming from: Home.  I have personally briefly reviewed patient's old medical records in [place]  Chief Complaint: Cough and shortness of breath.  HPI: Skip Litke is a 74 y.o. male with medical history significant of osteoarthritis, hypertension, hypothyroidism, history of subcutaneous cysts, former remote smoker, COPD who is coming to the emergency department due to progressively worse dyspnea associated with 2 to 3 months of productive cough, left-sided pleuritic chest pain, decreased appetite, 15 pound weight loss in the last 2 to 3 months and recent orthopnea.  He denies fever, night sweats, rhinorrhea, sore throat or hemoptysis.  No dizziness, palpitations, diaphoresis, but positive PND, orthopnea and recent lower extremity edema.  He denies abdominal pain, nausea, emesis, diarrhea, constipation, melena or hematochezia.  No dysuria, frequency or hematuria.  No polyuria, polydipsia, polyphagia or blurred vision.  ED Course: Initial vital signs were temperature 99.6 F, pulse 102, respirations 16, blood pressure 142/87 mmHg and O2 sat 90% on room air.  O2 sat increased to 95 to 97% on 2 L via nasal cannula.  Urinalysis was unremarkable assess for rare bacteria.  SARS coronavirus 2 PCR was negative.  CBC showed a white count of 14.7, hemoglobin 12.5 g/dL and platelets 317.  Sodium is 133 and chloride 96 mmol/L.  Low other CMP electrolytes are within normal range when calcium is corrected to albumin.  Renal function was normal.  LFTs were within normal limits, except for an albumin of 2.8 g/dL.  BNP was 50.4 pg/mL.  Imaging: Significant for right infrahilar to hilar mass with metastatic adenopathy in the right hilum and paratracheal region.  There is small bilateral adrenal masses that could be adenomas or metastasis.  There is a lytic destructive lesion of the left  lateral fourth rib highly likely to be a metastatic lesion.  Moderate sized right effusion with dependent atelectasis.  Density in the adjacent right middle lobe and right upper lobe could be atelectasis, pneumonia or interstitial spread of carcinoma.  There is aortic atherosclerosis  Review of Systems: As per HPI otherwise all other systems reviewed and are negative.  Past Medical History:  Diagnosis Date  . Arthritis   . Hypertension   . Hypothyroidism   . Subcutaneous cysts, generalized    right thigh and lt buttock    Past Surgical History:  Procedure Laterality Date  . CYST EXCISION N/A 05/02/2015   Procedure: EXCISION OF CYST RIGHT THIGH AND BUTTOCK;  Surgeon: Armandina Gemma, MD;  Location: [facility];  Service: General;  Laterality: N/A;  . TONSILLECTOMY      Social History  reports that he has been smoking. He has been smoking about 1.00 pack per day. He does not have any smokeless tobacco history on file. He reports current alcohol use. He reports that he does not use drugs.  No Known Allergies  No family history on file.   Prior to Admission medications   Medication Sig Start Date End Date Taking? Authorizing Provider  COMBIVENT RESPIMAT 20-100 MCG/ACT AERS respimat Inhale 2 puffs into the lungs every 6 (six) hours as needed for wheezing or shortness of breath.  01/03/20  Yes [provider]  guaiFENesin (MUCINEX) 600 MG 12 hr tablet Take 600 mg by mouth 2 (two) times daily as needed for cough.   Yes [provider]  levothyroxine (SYNTHROID, LEVOTHROID) 100 MCG tablet Take 100 mcg  by mouth as directed. Take on MWF & Sat   Yes [provider]  amLODipine (NORVASC) 5 MG tablet Take 5 mg by mouth daily. Patient not taking: Reported on 01/24/2020    [provider]  HYDROcodone-acetaminophen (NORCO/VICODIN) 5-325 MG tablet Take 1-2 tablets by mouth every 4 (four) hours as needed for moderate pain. Patient not taking: Reported on  01/24/2020 05/02/15   Armandina Gemma, MD  meloxicam (MOBIC) 15 MG tablet Take 15 mg by mouth daily. Patient not taking: Reported on 01/24/2020    [provider]  naproxen sodium (ANAPROX) 220 MG tablet Take 220 mg by mouth 2 (two) times daily with a meal. Patient not taking: Reported on 01/24/2020    [provider]    Physical Exam: Vitals:   01/24/20 1546 01/24/20 1930 01/24/20 2044 01/24/20 2236  BP: (!) 161/93 (!) 160/88 (!) 149/91   Pulse: 95 99 (!) 101   Resp: (!) 31 18 18    Temp:   98.3 F (36.8 C)   TempSrc:   Oral   SpO2: 95% 94% 97% 95%  Weight:   113 kg   Height:   6' (1.829 m)     Constitutional: NAD, calm, comfortable Eyes: PERRL, lids and conjunctivae normal ENMT: Mucous membranes are moist. Posterior pharynx clear of any exudate or lesions. Neck: normal, supple, no masses, no thyromegaly Respiratory: Decreased breath sounds bilaterally, but particularly on RML and RLL.  There is mild bilateral wheezing and rhonchi, no crackles.  Mildly tachypneic when talking. No accessory muscle use.  Cardiovascular: Regular rate and rhythm, no murmurs / rubs / gallops. No extremity edema. 2+ pedal pulses. No carotid bruits.  Abdomen: Nondistended, obese.  BS positive.  Soft, no tenderness, no masses palpated. No hepatosplenomegaly.  Musculoskeletal: no clubbing / cyanosis. Good ROM, no contractures. Normal muscle tone.  Skin: no rashes, lesions, ulcers on very limited dermatological examination. Neurologic: CN 2-12 grossly intact. Sensation intact, DTR normal. Strength 5/5 in all 4.  Psychiatric: Normal judgment and insight. Alert and oriented x 3. Normal mood.   Labs on Admission: I have personally reviewed following labs and imaging studies  CBC: Recent Labs  Lab 01/24/20 1359  WBC 14.7*  NEUTROABS 12.3*  HGB 12.5*  HCT 38.8*  MCV 96.5  PLT 417   Basic Metabolic Panel: Recent Labs  Lab 01/24/20 1359  NA 133*  K 3.8  CL 96*  CO2 27  GLUCOSE 107*    BUN 13  CREATININE 0.90  CALCIUM 8.5*   GFR: Estimated Creatinine Clearance: 96.3 mL/min (by C-G formula based on SCr of 0.9 mg/dL).  Liver Function Tests: Recent Labs  Lab 01/24/20 1359  AST 22  ALT 20  ALKPHOS 85  BILITOT 1.1  PROT 7.8  ALBUMIN 2.8*   Urine analysis:    Component Value Date/Time   COLORURINE YELLOW 01/24/2020 1546   APPEARANCEUR CLEAR 01/24/2020 1546   LABSPEC 1.009 01/24/2020 [place] 6.0 01/24/2020 1546   GLUCOSEU NEGATIVE 01/24/2020 1546   HGBUR NEGATIVE 01/24/2020 1546   BILIRUBINUR NEGATIVE 01/24/2020 1546   KETONESUR NEGATIVE 01/24/2020 1546   PROTEINUR NEGATIVE 01/24/2020 1546   NITRITE NEGATIVE 01/24/2020 1546   LEUKOCYTESUR NEGATIVE 01/24/2020 1546   Radiological Exams on Admission: DG Chest 2 View  Result Date: 01/24/2020 CLINICAL DATA:  Short of breath. EXAM: CHEST - 2 VIEW COMPARISON:  None FINDINGS: Mild cardiac enlargement. Small left pleural effusion. Significantly diminished aeration to the right lower lobe is favored to represent a  combination of pleural effusion, atelectasis and airspace consolidation. No airspace opacities identified within the left lung. Remote right lower posterior rib deformities. IMPRESSION: Diminished aeration to the right lower lobe likely due to pleural effusion, atelectasis and airspace consolidation. Underlying mass lesion would be difficult to exclude. Consider further evaluation with CT of the chest with contrast material. Electronically Signed   By: Kerby Moors M.D.   On: 01/24/2020 11:59   CT Angio Chest PE W and/or Wo Contrast  Result Date: 01/24/2020 CLINICAL DATA:  Chest pain and shortness of breath over the last 2 months. EXAM: CT ANGIOGRAPHY CHEST WITH CONTRAST TECHNIQUE: Multidetector CT imaging of the chest was performed using the standard protocol during bolus administration of intravenous contrast. Multiplanar CT image reconstructions and MIPs were obtained to evaluate the vascular anatomy.  CONTRAST:  133mL OMNIPAQUE IOHEXOL 350 MG/ML SOLN COMPARISON:  Chest radiography same day FINDINGS: Cardiovascular: Pulmonary arterial opacification is good. No visible pulmonary emboli. There is aortic atherosclerosis without aneurysm or dissection. Heart size is normal. Coronary artery calcification is noted. Mediastinum/Nodes: Suspicion of a right infrahilar to hilar mass with right hilar adenopathy and right paratracheal adenopathy. Lungs/Pleura: As above, there is suspicion of a right hilar to infrahilar mass, possibly on the order of 4-5 cm in size, with right hilar and paratracheal adenopathy. There is a moderate pleural effusion on the right layering dependently. Right lower lobe is collapsed. There is airspace filling and interstitial prominence in the right middle lobe and dependent right upper lobe. No focal finding on the left. Upper Abdomen: Mild enlargement of the right adrenal gland with an area measuring approximately 10 x 19 mm that could be a metastasis or adenoma. Similar prominence of the inferior limb of the left adrenal gland. Areas of low-density in the liver are indeterminate and could be cysts, hemangiomas or metastatic disease. Musculoskeletal: Lytic destructive lesion of the left lateral fourth rib worrisome for bony metastatic disease. I do not see a spinal or sternal lesion. Review of the MIP images confirms the above findings. 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 Small bilateral adrenal masses that could be adenomas or metastases. Lytic destructive lesion of the left lateral fourth rib highly likely to be a metastatic lesion. Moderate size right effusion with dependent atelectasis. Density in the adjacent right middle lobe and right upper lobe could be atelectasis, pneumonia or interstitial spread of carcinoma. Aortic Atherosclerosis (ICD10-I70.0). Coronary artery calcification also present. Electronically  Signed   By: Nelson Chimes M.D.   On: 01/24/2020 17:15   EKG: Independently reviewed.  Vent. rate 100 BPM PR interval * ms QRS duration 104 ms QT/QTc 338/436 ms P-R-T axes 70 47 53 Sinus tachycardia Atrial premature complexes Prolonged PR interval Low voltage, precordial leads  Assessment/Plan Principal Problem:   Acute respiratory failure with hypoxia (HCC) Right hilar mass/pleural effusion/COPD exacerbation. Continue supplemental oxygen. As scheduled and as needed bronchodilators. Nebulized budesonide twice a day. Thoracentesis with IR tomorrow.  Active Problems:   Right hilar/infrahilar mass Continue bronchodilators and supplemental oxygen. Consult IR in the morning for biopsy/pleural effusion thoracentesis. Check CT abdomen/pelvis and CT head for staging purposes.    Pleural effusion on right Secondary to mass. Will consult IR in a.m. Continue supplemental oxygen    Hypertension Continue amlodipine 5 mg p.o. daily. Monitor blood pressure.    Hypothyroidism Continue levothyroxine 100 mcg p.o. daily.    Hypoalbuminemia Secondary to mass. Continue oral protein supplementation.  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 Monitor H&H.    Osteoarthritis Hold NSAIDs at this time.    Hyponatremia Monitor closely given possibility of lung CA.     DVT prophylaxis: SCDs. Code Status:   Full code. Family Communication:   Disposition Plan:   Patient is from:  Home.  Anticipated DC to:  Home.  Anticipated DC date:  01/24/2020.  Anticipated DC barriers: Clinical improvement and work-up.  Consults called:   Admission status:  Observation/telemetry.  Severity of Illness:  Reubin Milan MD [facility]  How to contact the [facility] Attending or Consulting provider [place] or covering provider during after hours [place], for this patient?   1. Check the care team in [facility] and look for a) attending/consulting [facility] provider listed and b) the [facility] team  listed 2. Log into [email or web address] and use [facility]'s universal password to access. If you do not have the password, please contact the hospital operator. 3. Locate the [facility] provider you are looking for under [facility] and page to a number that you can be directly reached. 4. If you still have difficulty reaching the provider, please page the [facility] (Director on Call) for the Hospitalists listed on amion for assistance.  01/24/2020, 10:54 PM   This document was prepared using Dragon voice recognition software and may contain some unintended transcription errors.

## 2020-01-24 NOTE — ED Notes (Signed)
Pt drops to 89% on RA with ambulation. Pt appears pale and winded. Pt placed back on 2 L nasal cannula

## 2020-01-24 NOTE — ED Provider Notes (Signed)
[facility] DEPT Provider Note   CSN: [id number] Arrival date & time: 01/24/20  1108     History Chief Complaint  Patient presents with  . Shortness of Breath    Jack Thompson is a 74 y.o. male.  HPI   HPI Comments: Jack Thompson is a 74 y.o. male with a medical history as noted below who presents to the Emergency Department complaining of worsening shortness of breath.  Patient notes a history of COPD but states that he has never been diagnosed with this further noting that he quit smoking 2.5 years ago.  About 2 to 3 months ago he began experiencing worsening shortness of breath, particularly when lying flat or when ambulating.  He has been sleeping in a recliner.  He additionally reports waxing and waning edema noted in the bilateral lower extremities.  This moderately alleviates at night when elevating his legs.  He states that he saw his PCP 6 weeks ago and was prescribed Combivent which he has been taking multiple times throughout the day with intermittent short-term relief.  He additionally takes Mucinex 2-3 times per day with intermittent short-term relief. He has his first appointment with pulmonology in November of this year.  He denies fevers, chills, URI symptoms, chest pain, abdominal pain, nausea, vomiting, diarrhea, syncope, dizziness, urinary changes.  Patient states he "drinks 1 pint of bourbon per day".  No drug use.     Past Medical History:  Diagnosis Date  . Arthritis   . Hypertension   . Hypothyroidism   . Subcutaneous cysts, generalized    right thigh and lt buttock    Patient Active Problem List   Diagnosis Date Noted  . Sebaceous cyst 05/01/2015    Past Surgical History:  Procedure Laterality Date  . CYST EXCISION N/A 05/02/2015   Procedure: EXCISION OF CYST RIGHT THIGH AND BUTTOCK;  Surgeon: Armandina Gemma, MD;  Location: [facility];  Service: General;  Laterality: N/A;  . TONSILLECTOMY         No family  history on file.  Social History   Tobacco Use  . Smoking status: Current Every Day Smoker    Packs/day: 1.00  Substance Use Topics  . Alcohol use: Yes    Comment: social  . Drug use: No    Home Medications Prior to Admission medications   Medication Sig Start Date End Date Taking? Authorizing Provider  amLODipine (NORVASC) 5 MG tablet Take 5 mg by mouth daily.    [provider]  HYDROcodone-acetaminophen (NORCO/VICODIN) 5-325 MG tablet Take 1-2 tablets by mouth every 4 (four) hours as needed for moderate pain. 05/02/15   Armandina Gemma, MD  levothyroxine (SYNTHROID, LEVOTHROID) 100 MCG tablet Take 100 mcg by mouth daily before breakfast.    [provider]  meloxicam (MOBIC) 15 MG tablet Take 15 mg by mouth daily.    [provider]  naproxen sodium (ANAPROX) 220 MG tablet Take 220 mg by mouth 2 (two) times daily with a meal.    [provider]    Allergies    Patient has no known allergies.  Review of Systems   Review of Systems  All other systems reviewed and are negative. Ten systems reviewed and are negative for acute change, except as noted in the HPI.    Physical Exam Updated Vital Signs BP (!) 142/87   Pulse (!) 102   Temp 99.6 F (37.6 C) (Oral)   Resp 16   SpO2 90%   Physical  Exam Vitals and nursing note reviewed.  Constitutional:      General: He is not in acute distress.    Appearance: Normal appearance. He is well-developed. He is obese. He is not ill-appearing, toxic-appearing or diaphoretic.  HENT:     Head: Normocephalic and atraumatic.     Right Ear: External ear normal.     Left Ear: External ear normal.     Nose: Nose normal.     Mouth/Throat:     Mouth: Mucous membranes are moist.     Pharynx: Oropharynx is clear. No oropharyngeal exudate or posterior oropharyngeal erythema.  Eyes:     Extraocular Movements: Extraocular movements intact.  Cardiovascular:     Rate and Rhythm: Regular rhythm. Tachycardia  present.     Pulses: Normal pulses.     Heart sounds: Normal heart sounds. No murmur heard.  No friction rub. No gallop.   Pulmonary:     Effort: Pulmonary effort is normal. No tachypnea, bradypnea, accessory muscle usage or respiratory distress.     Breath sounds: No stridor. Examination of the right-lower field reveals decreased breath sounds. Examination of the left-lower field reveals decreased breath sounds. Decreased breath sounds present. No wheezing, rhonchi or rales.  Chest:     Chest wall: No tenderness or crepitus.  Abdominal:     General: Abdomen is flat.     Palpations: Abdomen is soft.     Tenderness: There is no abdominal tenderness.  Musculoskeletal:        General: Normal range of motion.     Cervical back: Normal range of motion and neck supple. No tenderness.     Right lower leg: No tenderness. Edema present.     Left lower leg: No tenderness. Edema present.     Comments: 1+ pitting edema noted in the bilateral lower extremities  Skin:    General: Skin is warm and dry.  Neurological:     General: No focal deficit present.     Mental Status: He is alert and oriented to person, place, and time.  Psychiatric:        Mood and Affect: Mood normal.        Behavior: Behavior normal.    ED Results / Procedures / Treatments   Labs (all labs ordered are listed, but only abnormal results are displayed) Labs Reviewed  CBC WITH DIFFERENTIAL/PLATELET - Abnormal; Notable for the following components:      Result Value   WBC 14.7 (*)    RBC 4.02 (*)    Hemoglobin 12.5 (*)    HCT 38.8 (*)    Neutro Abs 12.3 (*)    Monocytes Absolute 1.1 (*)    All other components within normal limits  COMPREHENSIVE METABOLIC PANEL - Abnormal; Notable for the following components:   Sodium 133 (*)    Chloride 96 (*)    Glucose, Bld 107 (*)    Calcium 8.5 (*)    Albumin 2.8 (*)    All other components within normal limits  URINALYSIS, ROUTINE W REFLEX MICROSCOPIC - Abnormal; Notable  for the following components:   Bacteria, UA RARE (*)    All other components within normal limits  SARS CORONAVIRUS 2 BY RT PCR (HOSPITAL ORDER, [facility])  BRAIN NATRIURETIC PEPTIDE  CBC  RENAL FUNCTION PANEL   EKG EKG Interpretation  Date/Time:  Wednesday January 24 2020 11:22:16 EDT Ventricular Rate:  100 PR Interval:    QRS Duration: 104 QT Interval:  338 QTC  Calculation: 436 R Axis:   47 Text Interpretation: Sinus tachycardia Atrial premature complexes Prolonged PR interval Low voltage, precordial leads No significant change since 04/30/2015 Confirmed by Veryl Speak [phone]) on 01/24/2020 11:42:43 AM  Radiology DG Chest 2 View  Result Date: 01/24/2020 CLINICAL DATA:  Short of breath. EXAM: CHEST - 2 VIEW COMPARISON:  None FINDINGS: Mild cardiac enlargement. Small left pleural effusion. Significantly diminished aeration to the right lower lobe is favored to represent a combination of pleural effusion, atelectasis and airspace consolidation. No airspace opacities identified within the left lung. Remote right lower posterior rib deformities. IMPRESSION: Diminished aeration to the right lower lobe likely due to pleural effusion, atelectasis and airspace consolidation. Underlying mass lesion would be difficult to exclude. Consider further evaluation with CT of the chest with contrast material. Electronically Signed   By: Kerby Moors M.D.   On: 01/24/2020 11:59   CT Angio Chest PE W and/or Wo Contrast  Result Date: 01/24/2020 CLINICAL DATA:  Chest pain and shortness of breath over the last 2 months. EXAM: CT ANGIOGRAPHY CHEST WITH CONTRAST TECHNIQUE: Multidetector CT imaging of the chest was performed using the standard protocol during bolus administration of intravenous contrast. Multiplanar CT image reconstructions and MIPs were obtained to evaluate the vascular anatomy. CONTRAST:  171mL OMNIPAQUE IOHEXOL 350 MG/ML SOLN COMPARISON:  Chest radiography same day  FINDINGS: Cardiovascular: Pulmonary arterial opacification is good. No visible pulmonary emboli. There is aortic atherosclerosis without aneurysm or dissection. Heart size is normal. Coronary artery calcification is noted. Mediastinum/Nodes: Suspicion of a right infrahilar to hilar mass with right hilar adenopathy and right paratracheal adenopathy. Lungs/Pleura: As above, there is suspicion of a right hilar to infrahilar mass, possibly on the order of 4-5 cm in size, with right hilar and paratracheal adenopathy. There is a moderate pleural effusion on the right layering dependently. Right lower lobe is collapsed. There is airspace filling and interstitial prominence in the right middle lobe and dependent right upper lobe. No focal finding on the left. Upper Abdomen: Mild enlargement of the right adrenal gland with an area measuring approximately 10 x 19 mm that could be a metastasis or adenoma. Similar prominence of the inferior limb of the left adrenal gland. Areas of low-density in the liver are indeterminate and could be cysts, hemangiomas or metastatic disease. Musculoskeletal: Lytic destructive lesion of the left lateral fourth rib worrisome for bony metastatic disease. I do not see a spinal or sternal lesion. Review of the MIP images confirms the above findings. 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 Small bilateral adrenal masses that could be adenomas or metastases. Lytic destructive lesion of the left lateral fourth rib highly likely to be a metastatic lesion. Moderate size right effusion with dependent atelectasis. Density in the adjacent right middle lobe and right upper lobe could be atelectasis, pneumonia or interstitial spread of carcinoma. Aortic Atherosclerosis (ICD10-I70.0). Coronary artery calcification also present. Electronically Signed   By: Nelson Chimes M.D.   On: 01/24/2020 17:15   Procedures Procedures    Medications Ordered in ED Medications  acetaminophen (TYLENOL) tablet 650 mg (has no administration in time range)    Or  acetaminophen (TYLENOL) suppository 650 mg (has no administration in time range)  ondansetron (ZOFRAN) tablet 4 mg (has no administration in time range)    Or  ondansetron (ZOFRAN) injection 4 mg (has no administration in time range)  feeding supplement (ENSURE ENLIVE) (ENSURE ENLIVE) liquid 237 mL (has  no administration in time range)  budesonide (PULMICORT) nebulizer solution 0.5 mg (has no administration in time range)  guaiFENesin (MUCINEX) 12 hr tablet 600 mg (has no administration in time range)  chlorpheniramine-HYDROcodone (TUSSIONEX) 10-8 MG/5ML suspension 5 mL (has no administration in time range)  ipratropium-albuterol (DUONEB) 0.5-2.5 (3) MG/3ML nebulizer solution 3 mL (has no administration in time range)  ALPRAZolam (XANAX) tablet 0.25 mg (has no administration in time range)  iohexol (OMNIPAQUE) 350 MG/ML injection 100 mL (100 mLs Intravenous Contrast Given 01/24/20 1633)    ED Course  I have reviewed the triage vital signs and the nursing notes.  Pertinent labs & imaging results that were available during my care of the patient were reviewed by me and considered in my medical decision making (see chart for details).  Clinical Course as of Jan 23 2217  Wed Jan 24, 2020  1331 Diminished aeration to the right lower lobe likely due to pleural effusion, atelectasis and airspace consolidation.  Under lying mass lesion would be difficult to exclude.  Radiology recommends further evaluation with CT with contrast material.  Will obtain basic labs on the patient and pending his kidney function, will order this.  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  Small bilateral adrenal masses that could be adenomas or metastases. Lytic destructive lesion of the left lateral fourth rib highly likely to be a metastatic lesion.  Moderate size right effusion with dependent atelectasis. Density in the adjacent right middle lobe and right upper lobe could be atelectasis, pneumonia or interstitial spread of carcinoma.   CT Angio Chest PE W and/or Wo Contrast [LJ]  4327 74 year old male here with progressive shortness of breath over the period of months.  Borderline hypoxia here.  Slightly tachypneic.  CT showing mass with likely metastatic disease.  Plan is to review with oncology and likely admit the patient.   [MB]  1909 Pt ambulated by the nursing staff. His O2 saturations dropped to 89% and patient noted SOB.    [LJ]    Clinical Course User Index [LJ] Rayna Sexton, PA-C [MB] Hayden Rasmussen, MD   MDM Rules/Calculators/A&P                          Pt is a 74 y.o. male that present with a history, physical exam, ED Clinical Course as noted above.   Patient presents with worsening shortness of breath, orthopnea, leg swelling, cough. CT scan of the chest obtained with findings as noted above.   Discussed the patient with Dr. Alen Blew with oncology. He did not recommend any further imaging.  Recommended that a biopsy be obtained by IR.  I discussed the results of the imaging with the patient and the seriousness of these results. He verbalized understanding of this as well as next steps. I had the nursing staff ambulate the patient who desaturated to 89% and noted that he was significantly short of breath.  Discussed admission with the pt and he was amenable. I discussed with the hospitalist staff who will admit the patient at this time.  His COVID-19 test is been ordered.  Note: Portions of this report may have been transcribed using voice recognition software. Every effort was made to ensure accuracy; however, inadvertent computerized transcription  errors may be present.   Final Clinical Impression(s) / ED Diagnoses Final diagnoses:  Shortness of breath  Lung mass  Pleural effusion    Rx / DC Orders ED Discharge Orders    None       Rayna Sexton, PA-C 01/24/20 2220    Veryl Speak, MD 01/26/20 [phone]

## 2020-01-24 NOTE — ED Notes (Signed)
Attempted to call report for pt transfer to floor, receiving nurse busy at this time. Also attempted to call report to charge, this nurse was asked to call back. Will try again in a few minutes

## 2020-01-24 NOTE — Plan of Care (Signed)
Pt resting quietly, no s/s of distress. Continues on 2L .

## 2020-01-24 NOTE — ED Notes (Signed)
Pt given urinal and made aware of need for urine sample.

## 2020-01-25 ENCOUNTER — Observation Stay (HOSPITAL_COMMUNITY): Payer: Medicare HMO

## 2020-01-25 DIAGNOSIS — D630 Anemia in neoplastic disease: Secondary | ICD-10-CM | POA: Diagnosis not present

## 2020-01-25 DIAGNOSIS — Z85118 Personal history of other malignant neoplasm of bronchus and lung: Secondary | ICD-10-CM | POA: Diagnosis not present

## 2020-01-25 DIAGNOSIS — R05 Cough: Secondary | ICD-10-CM

## 2020-01-25 DIAGNOSIS — C771 Secondary and unspecified malignant neoplasm of intrathoracic lymph nodes: Secondary | ICD-10-CM | POA: Diagnosis not present

## 2020-01-25 DIAGNOSIS — I10 Essential (primary) hypertension: Secondary | ICD-10-CM | POA: Diagnosis not present

## 2020-01-25 DIAGNOSIS — R0602 Shortness of breath: Secondary | ICD-10-CM | POA: Diagnosis not present

## 2020-01-25 DIAGNOSIS — C3411 Malignant neoplasm of upper lobe, right bronchus or lung: Secondary | ICD-10-CM | POA: Diagnosis not present

## 2020-01-25 DIAGNOSIS — D72829 Elevated white blood cell count, unspecified: Secondary | ICD-10-CM | POA: Diagnosis present

## 2020-01-25 DIAGNOSIS — M898X8 Other specified disorders of bone, other site: Secondary | ICD-10-CM | POA: Diagnosis not present

## 2020-01-25 DIAGNOSIS — Z79890 Hormone replacement therapy (postmenopausal): Secondary | ICD-10-CM | POA: Diagnosis not present

## 2020-01-25 DIAGNOSIS — M899 Disorder of bone, unspecified: Secondary | ICD-10-CM | POA: Diagnosis present

## 2020-01-25 DIAGNOSIS — J441 Chronic obstructive pulmonary disease with (acute) exacerbation: Secondary | ICD-10-CM | POA: Diagnosis not present

## 2020-01-25 DIAGNOSIS — E279 Disorder of adrenal gland, unspecified: Secondary | ICD-10-CM | POA: Diagnosis not present

## 2020-01-25 DIAGNOSIS — R69 Illness, unspecified: Secondary | ICD-10-CM | POA: Diagnosis not present

## 2020-01-25 DIAGNOSIS — R918 Other nonspecific abnormal finding of lung field: Secondary | ICD-10-CM

## 2020-01-25 DIAGNOSIS — Z79891 Long term (current) use of opiate analgesic: Secondary | ICD-10-CM | POA: Diagnosis not present

## 2020-01-25 DIAGNOSIS — G4733 Obstructive sleep apnea (adult) (pediatric): Secondary | ICD-10-CM | POA: Diagnosis present

## 2020-01-25 DIAGNOSIS — Z9889 Other specified postprocedural states: Secondary | ICD-10-CM | POA: Diagnosis not present

## 2020-01-25 DIAGNOSIS — I6381 Other cerebral infarction due to occlusion or stenosis of small artery: Secondary | ICD-10-CM | POA: Diagnosis not present

## 2020-01-25 DIAGNOSIS — C779 Secondary and unspecified malignant neoplasm of lymph node, unspecified: Secondary | ICD-10-CM | POA: Diagnosis not present

## 2020-01-25 DIAGNOSIS — C3401 Malignant neoplasm of right main bronchus: Secondary | ICD-10-CM | POA: Diagnosis not present

## 2020-01-25 DIAGNOSIS — Z79899 Other long term (current) drug therapy: Secondary | ICD-10-CM | POA: Diagnosis not present

## 2020-01-25 DIAGNOSIS — R059 Cough, unspecified: Secondary | ICD-10-CM | POA: Diagnosis present

## 2020-01-25 DIAGNOSIS — M1990 Unspecified osteoarthritis, unspecified site: Secondary | ICD-10-CM

## 2020-01-25 DIAGNOSIS — J90 Pleural effusion, not elsewhere classified: Secondary | ICD-10-CM

## 2020-01-25 DIAGNOSIS — C3490 Malignant neoplasm of unspecified part of unspecified bronchus or lung: Secondary | ICD-10-CM | POA: Diagnosis not present

## 2020-01-25 DIAGNOSIS — F17210 Nicotine dependence, cigarettes, uncomplicated: Secondary | ICD-10-CM | POA: Diagnosis present

## 2020-01-25 DIAGNOSIS — I639 Cerebral infarction, unspecified: Secondary | ICD-10-CM | POA: Diagnosis not present

## 2020-01-25 DIAGNOSIS — E039 Hypothyroidism, unspecified: Secondary | ICD-10-CM | POA: Diagnosis not present

## 2020-01-25 DIAGNOSIS — I119 Hypertensive heart disease without heart failure: Secondary | ICD-10-CM | POA: Diagnosis not present

## 2020-01-25 DIAGNOSIS — I6782 Cerebral ischemia: Secondary | ICD-10-CM | POA: Diagnosis not present

## 2020-01-25 DIAGNOSIS — J9811 Atelectasis: Secondary | ICD-10-CM | POA: Diagnosis not present

## 2020-01-25 DIAGNOSIS — C3431 Malignant neoplasm of lower lobe, right bronchus or lung: Secondary | ICD-10-CM | POA: Diagnosis not present

## 2020-01-25 DIAGNOSIS — J9601 Acute respiratory failure with hypoxia: Secondary | ICD-10-CM | POA: Diagnosis not present

## 2020-01-25 DIAGNOSIS — D649 Anemia, unspecified: Secondary | ICD-10-CM | POA: Diagnosis not present

## 2020-01-25 DIAGNOSIS — E871 Hypo-osmolality and hyponatremia: Secondary | ICD-10-CM | POA: Diagnosis not present

## 2020-01-25 DIAGNOSIS — J948 Other specified pleural conditions: Secondary | ICD-10-CM | POA: Diagnosis not present

## 2020-01-25 DIAGNOSIS — Z20822 Contact with and (suspected) exposure to covid-19: Secondary | ICD-10-CM | POA: Diagnosis not present

## 2020-01-25 DIAGNOSIS — E8809 Other disorders of plasma-protein metabolism, not elsewhere classified: Secondary | ICD-10-CM | POA: Diagnosis not present

## 2020-01-25 DIAGNOSIS — J449 Chronic obstructive pulmonary disease, unspecified: Secondary | ICD-10-CM | POA: Diagnosis not present

## 2020-01-25 DIAGNOSIS — J910 Malignant pleural effusion: Secondary | ICD-10-CM | POA: Diagnosis not present

## 2020-01-25 DIAGNOSIS — R410 Disorientation, unspecified: Secondary | ICD-10-CM | POA: Diagnosis not present

## 2020-01-25 DIAGNOSIS — I7090 Unspecified atherosclerosis: Secondary | ICD-10-CM | POA: Diagnosis not present

## 2020-01-25 DIAGNOSIS — Z7951 Long term (current) use of inhaled steroids: Secondary | ICD-10-CM | POA: Diagnosis not present

## 2020-01-25 LAB — CBC
HCT: 36.5 % — ABNORMAL LOW (ref 39.0–52.0)
Hemoglobin: 11.6 g/dL — ABNORMAL LOW (ref 13.0–17.0)
MCH: 30.6 pg (ref 26.0–34.0)
MCHC: 31.8 g/dL (ref 30.0–36.0)
MCV: 96.3 fL (ref 80.0–100.0)
Platelets: 328 10*3/uL (ref 150–400)
RBC: 3.79 MIL/uL — ABNORMAL LOW (ref 4.22–5.81)
RDW: 14.9 % (ref 11.5–15.5)
WBC: 15.7 10*3/uL — ABNORMAL HIGH (ref 4.0–10.5)
nRBC: 0 % (ref 0.0–0.2)

## 2020-01-25 LAB — RENAL FUNCTION PANEL
Albumin: 2.6 g/dL — ABNORMAL LOW (ref 3.5–5.0)
Anion gap: 10 (ref 5–15)
BUN: 14 mg/dL (ref 8–23)
CO2: 26 mmol/L (ref 22–32)
Calcium: 8.6 mg/dL — ABNORMAL LOW (ref 8.9–10.3)
Chloride: 97 mmol/L — ABNORMAL LOW (ref 98–111)
Creatinine, Ser: 0.82 mg/dL (ref 0.61–1.24)
GFR calc Af Amer: >60 mL/min (ref >60)
GFR calc non Af Amer: >60 mL/min (ref >60)
Glucose, Bld: 105 mg/dL — ABNORMAL HIGH (ref 70–99)
Phosphorus: 4 mg/dL (ref 2.5–4.6)
Potassium: 3.7 mmol/L (ref 3.5–5.1)
Sodium: 133 mmol/L — ABNORMAL LOW (ref 135–145)

## 2020-01-25 LAB — BODY FLUID CELL COUNT WITH DIFFERENTIAL
Lymphs, Fluid: 62 %
Monocyte-Macrophage-Serous Fluid: 4 % — ABNORMAL LOW (ref 50–90)
Neutrophil Count, Fluid: 34 % — ABNORMAL HIGH (ref 0–25)
Total Nucleated Cell Count, Fluid: 1468 cu mm — ABNORMAL HIGH (ref 0–1000)

## 2020-01-25 LAB — AMYLASE, PLEURAL OR PERITONEAL FLUID: Amylase, Fluid: 41 U/L

## 2020-01-25 LAB — ALBUMIN, PLEURAL OR PERITONEAL FLUID: Albumin, Fluid: 2.1 g/dL

## 2020-01-25 LAB — GLUCOSE, PLEURAL OR PERITONEAL FLUID: Glucose, Fluid: 104 mg/dL

## 2020-01-25 MED ORDER — LIDOCAINE-EPINEPHRINE (PF) 2 %-1:200000 IJ SOLN
INTRAMUSCULAR | Status: AC
  Filled 2020-01-25: qty 20

## 2020-01-25 MED ORDER — LIDOCAINE HCL 1 % IJ SOLN
INTRAMUSCULAR | Status: AC
  Filled 2020-01-25: qty 20

## 2020-01-25 MED ORDER — AMLODIPINE BESYLATE 5 MG PO TABS
5.0000 mg | ORAL_TABLET | Freq: Every day | ORAL | Status: DC
  Administered 2020-01-25 – 2020-01-26 (×2): 5 mg via ORAL
  Filled 2020-01-25 (×2): qty 1

## 2020-01-25 MED ORDER — LEVOTHYROXINE SODIUM 100 MCG PO TABS
100.0000 ug | ORAL_TABLET | ORAL | Status: DC
  Administered 2020-01-26 – 2020-01-29 (×3): 100 ug via ORAL
  Filled 2020-01-25 (×3): qty 1

## 2020-01-25 NOTE — TOC Initial Note (Signed)
Transition of Care [facility]) - Initial/Assessment Note    Patient Details  Name: Jack Thompson MRN: [id number] Date of Birth: 24-Sep-1945  [facility] CM/SW Contact:    Dessa Phi, RN Phone Number: 01/25/2020, 11:24 AM  Clinical Narrative: Patient d/c plan home. On 02 will monitor if needed @ d/c.                  Expected Discharge Plan: Home/Self Care Barriers to Discharge: Continued Medical Work up   Patient Goals and CMS Choice Patient states their goals for this hospitalization and ongoing recovery are:: go home CMS Medicare.gov Compare Post Acute Care list provided to:: Patient    Expected Discharge Plan and Services Expected Discharge Plan: Home/Self Care   Discharge Planning Services: CM Consult   Living arrangements for the past 2 months: Single Family Home                                      Prior Living Arrangements/Services Living arrangements for the past 2 months: Single Family Home Lives with:: Self Patient language and need for interpreter reviewed:: Yes Do you feel safe going back to the place where you live?: Yes      Need for Family Participation in Patient Care: No (Comment) Care giver support system in place?: Yes (comment)   Criminal Activity/Legal Involvement Pertinent to Current Situation/Hospitalization: No - Comment as needed  Activities of Daily Living Home Assistive Devices/Equipment: None ADL Screening (condition at time of admission) Patient's cognitive ability adequate to safely complete daily activities?: Yes Is the patient deaf or have difficulty hearing?: No Does the patient have difficulty seeing, even when wearing glasses/contacts?: No Does the patient have difficulty concentrating, remembering, or making decisions?: No Patient able to express need for assistance with ADLs?: Yes Does the patient have difficulty dressing or bathing?: No Independently performs ADLs?: Yes (appropriate for developmental age) Does  the patient have difficulty walking or climbing stairs?: Yes Weakness of Legs: Both Weakness of Arms/Hands: Both  Permission Sought/Granted Permission sought to share information with : Case Manager Permission granted to share information with : Yes, Verbal Permission Granted  Share Information with NAME: Case Manager           Emotional Assessment Appearance:: Appears stated age Attitude/Demeanor/Rapport: Gracious Affect (typically observed): Accepting Orientation: : Oriented to Self, Oriented to Place, Oriented to  Time, Oriented to Situation Alcohol / Substance Use: Not Applicable Psych Involvement: No (comment)  Admission diagnosis:  Shortness of breath [R06.02] Lung mass [R91.8] Pleural effusion [J90] Acute respiratory failure with hypoxia ([place]) [J96.01] Patient Active Problem List   Diagnosis Date Noted  . Acute respiratory failure with hypoxia ([place]) 01/24/2020  . Hypertension   . Hypothyroidism   . Hypoalbuminemia   . Normocytic anemia   . Osteoarthritis   . Hyponatremia   . Pleural effusion on right   . Right hilar/infrahilar mass   . Sebaceous cyst 05/01/2015   PCP:  Vernie Shanks, MD Pharmacy:   [facility] [address]. [place] [address] Phone: [phone] Fax: [phone]     Social Determinants of Health (SDOH) Interventions    Readmission Risk Interventions No flowsheet data found.

## 2020-01-25 NOTE — Progress Notes (Addendum)
[facility]  PROGRESS NOTE  Duval Macleod XIP:[id number] DOB: [birth year] DOA: 01/24/2020 PCP: Vernie Shanks, MD Admit date - 01/24/2020   Admitting Physician Reubin Milan, MD  Outpatient Primary MD for the patient is Vernie Shanks, MD  LOS - 0 Brief Narrative   Jack Thompson is a 74 y.o. year old male with medical history significant for tobacco abuse (45-year smoker, quit 25 years ago), HTN, OA who presented on 01/24/2020 with worsening productive cough and dyspnea on going for the past 2 months with minimal improvement.  Patient states he was seen by his primary.  2 months ago prescribed Combivent inhaler in addition to his usual chest x-ray is coughing episodes.  He has required Combivent 4 times daily.  His cough is occasionally productive of clear sputum but typically despite multiple coughing episodes the phlegm stays stuck in his throat.  He denies any fevers, chills, no hemoptysis, no recent antibiotic use.  He reports 20 pound weight loss over the past 2 months with minimal appetite.  Day prior to admission patient states worsening dyspnea, coughing episode and very minimal improvement with inhalers.  In the ED, he was afebrile with a T-max, [29 SPO2 90% put on 2 L, blood pressure 160/88.  Covid test negative (vaccination per record March 2001).  WBC 14.7.  Hemoglobin 12.5, sodium 133, BUN 20. Showed persistent airspace consolidation.  CTA chest negative for PE, moderate right effusion infrahilar mass with metastatic peritracheal lesion, small bilateral adrenal masses represent adenoma/infectious   Subjective  Today still having frequent coughing episodes, occasionally productive, no chest pain.  A & P   Acute hypoxic respiratory failure of multifactorial etiology including right size moderate pleural effusion with dependent atelectasis and infrahilar mass.  Currently on 2 L, no O2 requirements at home.  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  Very concerning for malignancy given patient's smoking history and 62-month history of worsening cough not responsive to medication regimen -IR consulted for diagnostic/therapeutic thoracentesis, cytology/Gram stain/cultures to be obtained -Discussed with PCCM will await cytology results, if cytology negative would need to re-consult pulmonology at that time for likely bronchoscopy to obtain tissue -Encourage incentive spirometry  Leukocytosis, mild  Remains afebrile white count is up trended slightly from 14.7-15.7.  Suspect this morning likely stress-induced however patient is at increased risk for postobstructive related to mass HTN closely monitor for fever curve and potential start of antibiotics  Normocytic anemia, stable Likely related to chronic etiology, high suspicion for malignancy.  No signs or symptoms of blood loss -Monitor CBC  Hyponatremia If this is the malignancy could be related to such -Monitor BMP  Hypothyroidism -Continue Synthroid  Hypertension SBP elevated in the 150s to 160s -Amlodipine 5 mg    Family Communication  : None  Code Status : Full  Disposition Plan  :  Patient is from home. Anticipated d/c date: 2 to 3 days. Barriers to d/c or necessity for inpatient status:  Consults  : , IR  Procedures  : IR guided thoracentesis, 7/22  DVT Prophylaxis  : SCDs  Lab Results  Component  Value Date   PLT 328 01/25/2020    Diet :  Diet Order            Diet NPO time  specified  Diet effective now                  Inpatient Medications Scheduled Meds: . budesonide (PULMICORT) nebulizer solution  0.5 mg Nebulization BID  . feeding supplement (ENSURE ENLIVE)  237 mL Oral BID BM  . guaiFENesin  600 mg Oral BID  . ipratropium-albuterol  3 mL Nebulization TID   Continuous Infusions: PRN Meds:.acetaminophen **OR** acetaminophen, ALPRAZolam, chlorpheniramine-HYDROcodone, ondansetron **OR** ondansetron (ZOFRAN) IV  Antibiotics  :   Anti-infectives (From admission, onward)   None       Objective   Vitals:   01/25/20 0202 01/25/20 0516 01/25/20 0802 01/25/20 0853  BP: (!) 148/97 (!) 161/89  (!) 143/79  Pulse: 96 99  92  Resp: 18 (!) 24  (!) 24  Temp: 98.1 F (36.7 C) 98.3 F (36.8 C)  (!) 97.3 F (36.3 C)  TempSrc: Oral Oral  Oral  SpO2: 97% 94% 93% 95%  Weight:      Height:        SpO2: 95 % O2 Flow Rate (L/min): 2 L/min  Wt Readings from Last 3 Encounters:  01/24/20 113 kg  05/02/15 102.1 kg     Intake/Output Summary (Last 24 hours) at 01/25/2020 1147 Last data filed at 01/25/2020 0700 Gross per 24 hour  Intake 120 ml  Output 350 ml  Net -230 ml    Physical Exam:     Awake Alert, Oriented X 3, Normal affect No new F.N deficits,  [place].AT, Normal respiratory effort on 2 L, diminished breath sounds in right lung, no wheezing, rhonchorous crackles at bases RRR,No Gallops,Rubs or new Murmurs,  +ve B.Sounds, Abd Soft, No tenderness, No rebound, guarding or rigidity. No Cyanosis, No new Rash or bruise     I have personally reviewed the following:   Data Reviewed:  CBC Recent Labs  Lab 01/24/20 1359 01/25/20 0514  WBC 14.7* 15.7*  HGB 12.5* 11.6*  HCT 38.8* 36.5*  PLT 317 328  MCV 96.5 96.3  MCH 31.1 30.6  MCHC 32.2 31.8  RDW 14.9 14.9  LYMPHSABS 1.1  --   MONOABS 1.1*  --   EOSABS 0.1  --   BASOSABS 0.1  --     Chemistries  Recent Labs  Lab 01/24/20 1359 01/25/20 0514  NA 133* 133*  K 3.8 3.7  CL  96* 97*  CO2 27 26  GLUCOSE 107* 105*  BUN 13 14  CREATININE 0.90 0.82  CALCIUM 8.5* 8.6*  AST 22  --   ALT 20  --   ALKPHOS 85  --   BILITOT 1.1  --    ------------------------------------------------------------------------------------------------------------------ No results for input(s): CHOL, HDL, LDLCALC, TRIG, CHOLHDL, LDLDIRECT in the last 72 hours.  No results found for: HGBA1C ------------------------------------------------------------------------------------------------------------------ No results for input(s): TSH, T4TOTAL, T3FREE, THYROIDAB in the last 72 hours.  Invalid input(s): FREET3 ------------------------------------------------------------------------------------------------------------------ No results for input(s): VITAMINB12, FOLATE, FERRITIN, TIBC, IRON, RETICCTPCT in the last 72 hours.  Coagulation profile No results for input(s): INR, PROTIME in the last 168 hours.  No results for input(s): DDIMER in the last 72 hours.  Cardiac Enzymes No results for input(s): CKMB, TROPONINI, MYOGLOBIN in the last 168 hours.  Invalid input(s): CK ------------------------------------------------------------------------------------------------------------------    Component Value Date/Time   BNP 50.4 01/24/2020 1359    Micro Results  Recent Results (from the past 240 hour(s))  SARS Coronavirus 2 by RT PCR (hospital order, performed in [facility] hospital lab) Nasopharyngeal Nasopharyngeal Swab     Status: None   Collection Time: 01/24/20  8:00 PM   Specimen: Nasopharyngeal Swab  Result Value Ref Range Status   SARS Coronavirus 2 NEGATIVE NEGATIVE Final    Comment: (NOTE) SARS-CoV-2 target nucleic acids are NOT DETECTED.  The SARS-CoV-2 RNA is generally detectable in upper and lower respiratory specimens during the acute phase of infection. The lowest concentration of SARS-CoV-2 viral copies this assay can detect is 250 copies / mL. A negative result does  not preclude SARS-CoV-2 infection and should not be used as the sole basis for treatment or other patient management decisions.  A negative result may occur with improper specimen collection / handling, submission of specimen other than nasopharyngeal swab, presence of viral mutation(s) within the areas targeted by this assay, and inadequate number of viral copies (<250 copies / mL). A negative result must be combined with clinical observations, patient history, and epidemiological information.  Fact Sheet for Patients:   StrictlyIdeas.no  Fact Sheet for Healthcare Providers: BankingDealers.co.za  This test is not yet approved or  cleared by the [place] FDA and has been authorized for detection and/or diagnosis of SARS-CoV-2 by FDA under an Emergency Use Authorization (EUA).  This EUA will remain in effect (meaning this test can be used) for the duration of the COVID-19 declaration under Section 564(b)(1) of the Act, 21 U.S.C. section 360bbb-3(b)(1), unless the authorization is terminated or revoked sooner.  Performed at [facility], [place] [address] [place], [place] [address]     Radiology Reports DG Chest 2 View  Result Date: 01/24/2020 CLINICAL DATA:  Short of breath. EXAM: CHEST - 2 VIEW COMPARISON:  None FINDINGS: Mild cardiac enlargement. Small left pleural effusion. Significantly diminished aeration to the right lower lobe is favored to represent a combination of pleural effusion, atelectasis and airspace consolidation. No airspace opacities identified within the left lung. Remote right lower posterior rib deformities. IMPRESSION: Diminished aeration to the right lower lobe likely due to pleural effusion, atelectasis and airspace consolidation. Underlying mass lesion would be difficult to exclude. Consider further evaluation with CT of the chest with contrast material. Electronically Signed   By: Kerby Moors M.D.   On: 01/24/2020 11:59   CT Angio Chest PE W and/or Wo Contrast  Result Date: 01/24/2020 CLINICAL DATA:  Chest pain and shortness of breath over the last 2 months. EXAM: CT ANGIOGRAPHY CHEST WITH CONTRAST TECHNIQUE: Multidetector CT imaging of the chest was performed using the standard protocol during bolus administration of intravenous contrast. Multiplanar CT image reconstructions and MIPs were obtained to evaluate the vascular anatomy. CONTRAST:  155mL OMNIPAQUE IOHEXOL 350 MG/ML SOLN COMPARISON:  Chest radiography same day FINDINGS: Cardiovascular: Pulmonary arterial opacification is good. No visible pulmonary emboli. There is aortic atherosclerosis without aneurysm or dissection. Heart size is normal. Coronary artery calcification is noted. Mediastinum/Nodes: Suspicion of a right infrahilar to hilar mass with right hilar adenopathy and right paratracheal adenopathy. Lungs/Pleura: As above, there is suspicion of a right hilar to infrahilar mass, possibly on the order of 4-5 cm in size, with right hilar and paratracheal adenopathy. There is a moderate pleural effusion on the right layering dependently. Right lower lobe is collapsed. There is airspace filling and interstitial prominence in the right middle lobe and dependent right upper lobe. No focal finding on the left. Upper Abdomen:  Mild enlargement of the right adrenal gland with an area measuring approximately 10 x 19 mm that could be a metastasis or adenoma. Similar prominence of the inferior limb of the left adrenal gland. Areas of low-density in the liver are indeterminate and could be cysts, hemangiomas or metastatic disease. Musculoskeletal: Lytic destructive lesion of the left lateral fourth rib worrisome for bony metastatic disease. I do not see a spinal or sternal lesion. Review of the MIP images confirms the above findings. 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 Small bilateral adrenal masses that could be adenomas or metastases. Lytic destructive lesion of the left lateral fourth rib highly likely to be a metastatic lesion. Moderate size right effusion with dependent atelectasis. Density in the adjacent right middle lobe and right upper lobe could be atelectasis, pneumonia or interstitial spread of carcinoma. Aortic Atherosclerosis (ICD10-I70.0). Coronary artery calcification also present. Electronically Signed   By: Nelson Chimes M.D.   On: 01/24/2020 17:15     Time Spent in minutes  30     Desiree Hane M.D on 01/25/2020 at 11:47 AM  To page go to [email or web address] - password [facility]

## 2020-01-25 NOTE — Care Plan (Signed)
Pulmonology Consult regarding lung mass. Have spoke to pulmonologist. We will wait for cytology results from Thoracentesis. If cytology is negative please re-consult for Bronchoscopy.

## 2020-01-25 NOTE — Care Management Obs Status (Signed)
[facility] NOTIFICATION   Patient Details  Name: Jack Thompson MRN: [id number] Date of Birth: 06-03-1946   Medicare Observation Status Notification Given:  Yes    MahabirJuliann Pulse, RN 01/25/2020, 11:18 AM

## 2020-01-25 NOTE — Procedures (Signed)
PROCEDURE SUMMARY:  Successful image-guided right thoracentesis. Yielded 1.2 liters of hazy amber fluid. Patient tolerated procedure well. No immediate complications. EBL < 3 mL.  Specimen was sent for labs. CXR ordered.  Please see imaging section of Epic for full dictation.   Claris Pong Rynell Ciotti PA-C 01/25/2020 1:18 PM

## 2020-01-26 DIAGNOSIS — R918 Other nonspecific abnormal finding of lung field: Secondary | ICD-10-CM | POA: Diagnosis not present

## 2020-01-26 DIAGNOSIS — J9601 Acute respiratory failure with hypoxia: Secondary | ICD-10-CM | POA: Diagnosis not present

## 2020-01-26 DIAGNOSIS — E8809 Other disorders of plasma-protein metabolism, not elsewhere classified: Secondary | ICD-10-CM | POA: Diagnosis not present

## 2020-01-26 DIAGNOSIS — E871 Hypo-osmolality and hyponatremia: Secondary | ICD-10-CM

## 2020-01-26 DIAGNOSIS — R05 Cough: Secondary | ICD-10-CM | POA: Diagnosis not present

## 2020-01-26 LAB — CBC
HCT: 34.9 % — ABNORMAL LOW (ref 39.0–52.0)
Hemoglobin: 11.1 g/dL — ABNORMAL LOW (ref 13.0–17.0)
MCH: 30.9 pg (ref 26.0–34.0)
MCHC: 31.8 g/dL (ref 30.0–36.0)
MCV: 97.2 fL (ref 80.0–100.0)
Platelets: 349 10*3/uL (ref 150–400)
RBC: 3.59 MIL/uL — ABNORMAL LOW (ref 4.22–5.81)
RDW: 14.9 % (ref 11.5–15.5)
WBC: 13.2 10*3/uL — ABNORMAL HIGH (ref 4.0–10.5)
nRBC: 0 % (ref 0.0–0.2)

## 2020-01-26 LAB — BASIC METABOLIC PANEL
Anion gap: 12 (ref 5–15)
BUN: 13 mg/dL (ref 8–23)
CO2: 24 mmol/L (ref 22–32)
Calcium: 8.4 mg/dL — ABNORMAL LOW (ref 8.9–10.3)
Chloride: 94 mmol/L — ABNORMAL LOW (ref 98–111)
Creatinine, Ser: 0.72 mg/dL (ref 0.61–1.24)
GFR calc Af Amer: >60 mL/min (ref >60)
GFR calc non Af Amer: >60 mL/min (ref >60)
Glucose, Bld: 113 mg/dL — ABNORMAL HIGH (ref 70–99)
Potassium: 3.6 mmol/L (ref 3.5–5.1)
Sodium: 130 mmol/L — ABNORMAL LOW (ref 135–145)

## 2020-01-26 LAB — GRAM STAIN

## 2020-01-26 LAB — NA AND K (SODIUM & POTASSIUM), RAND UR
Potassium Urine: 10 mmol/L
Sodium, Ur: <10 mmol/L

## 2020-01-26 LAB — CYTOLOGY - NON PAP

## 2020-01-26 LAB — OSMOLALITY, URINE: Osmolality, Ur: 286 mOsm/kg — ABNORMAL LOW (ref 300–900)

## 2020-01-26 LAB — CHLORIDE, URINE, RANDOM: Chloride Urine: 16 mmol/L

## 2020-01-26 LAB — OSMOLALITY: Osmolality: 286 mOsm/kg (ref 275–295)

## 2020-01-26 MED ORDER — ENSURE ENLIVE PO LIQD
237.0000 mL | Freq: Three times a day (TID) | ORAL | Status: DC
  Administered 2020-01-26: 237 mL via ORAL

## 2020-01-26 MED ORDER — SODIUM CHLORIDE 0.9 % IV SOLN: INTRAVENOUS | Status: AC

## 2020-01-26 NOTE — Progress Notes (Signed)
Initial Nutrition Assessment  DOCUMENTATION CODES:   Obesity unspecified  INTERVENTION:  Increase Ensure Enlive po TID, each supplement provides 350 kcal and 20 grams of protein  NUTRITION DIAGNOSIS:   Inadequate oral intake related to decreased appetite (secondary to progressively worsening dyspnea and productive cough x 2-3 months) as evidenced by per patient/family report.  GOAL:   Patient will meet greater than or equal to 90% of their needs    MONITOR:   Labs, I & O's, Supplement acceptance, Weight trends, PO intake  REASON FOR ASSESSMENT:   Malnutrition Screening Tool    ASSESSMENT:  RD working remotely.  74 year old male admitted for acute respiratory failure with hypoxia after presenting with progressively worse dyspnea associated with 2-3 months of productive cough, left-sided pleuritic chest pain, decreased appetite with weight loss, and recent orthopnea. Past medical history significant for osteoarthritis, HTN, hypothyroidism, and COPD.  Imaging significant for right infrahilar to hilar mass with metastatic adenopathy in right hilum and paratracheal region. Patient is s/p US guided thoracentesis on 7/22, noted 1.2 L of hazy amber fluid removed. Pulmonology consulted, pending cytology results from thoracentesis. Plans for bronchoscopy if cytology is negative.  Unable to reach pt via phone today to obtain nutrition history. Per flowsheets, pt has consumed 50-100% x 2 documented meals this admission. Patient reports decreased appetite and would benefit from nutrient dense supplement. Per medication review he is receiving Ensure BID, will increase to TID to aid with meeting needs.   Current wt 248.6 lb, noted moderate pitting BLE edema masking actual weight. Per H&P pt endorsed 15 lb wt loss in the past 2-3 months. No recent wt history available for review, unable to identify. Last weight prior to admission, 119.4 kg (262.68 lb) on 11/02/17.  Medications reviewed Labs:  Na 130 (L), WBC 13.2 (H), K 3.6 (WNL)   NUTRITION - FOCUSED PHYSICAL EXAM: Unable to complete at this time, RD working remotely.  Diet Order:   Diet Order            Diet Heart Room service appropriate? Yes; Fluid consistency: Thin  Diet effective now                 EDUCATION NEEDS:   No education needs have been identified at this time  Skin:  Skin Assessment: Reviewed RN Assessment  Last BM:  7/22  Height:   Ht Readings from Last 1 Encounters:  01/24/20 6' (1.829 m)    Weight:   Wt Readings from Last 1 Encounters:  01/24/20 113 kg    Ideal Body Weight:  80.9 kg  BMI:  Body mass index is 33.79 kg/m.  Estimated Nutritional Needs:   Kcal:  3009-2330  Protein:  115-125  Fluid:  >/= 2.3 L   Lajuan Lines, RD, LDN Clinical Nutrition After Hours/Weekend Pager # in [place]

## 2020-01-26 NOTE — Plan of Care (Signed)

## 2020-01-26 NOTE — Progress Notes (Signed)
[facility]  PROGRESS NOTE  Jack Thompson YSA:[id number] DOB: 15-Sep-1945 DOA: 01/24/2020 PCP: Vernie Shanks, MD Admit date - 01/24/2020   Admitting Physician Desiree Hane, MD  Outpatient Primary MD for the patient is Vernie Shanks, MD  LOS - 1 Brief Narrative   Jack Thompson is a 74 y.o. year old male with medical history significant for tobacco abuse (45-year smoker, quit 25 years ago), HTN, OA who presented on 01/24/2020 with worsening productive cough and dyspnea on going for the past 2 months with minimal improvement.  Patient states he was seen by his primary.  2 months ago prescribed Combivent inhaler in addition to his usual chest x-ray is coughing episodes.  He has required Combivent 4 times daily.  His cough is occasionally productive of clear sputum but typically despite multiple coughing episodes the phlegm stays stuck in his throat.  He denies any fevers, chills, no hemoptysis, no recent antibiotic use.  He reports 20 pound weight loss over the past 2 months with minimal appetite.  Day prior to admission patient states worsening dyspnea, coughing episode and very minimal improvement with inhalers.  In the ED, he was afebrile with a T-max, [29 SPO2 90% put on 2 L, blood pressure 160/88.  Covid test negative (vaccination per record March 2001).  WBC 14.7.  Hemoglobin 12.5, sodium 133, BUN 20. Showed persistent airspace consolidation.  CTA chest negative for PE, moderate right effusion infrahilar mass with metastatic peritracheal lesion, small bilateral adrenal masses represent adenoma/infectious   Subjective  Feels breathing is slightly improved after thoracentesis yesterday.  Still continues to have cough with not much production.  A & P   Acute hypoxic respiratory failure of multifactorial etiology including right size moderate pleural effusion with dependent atelectasis and infrahilar mass.  Currently on 3L, no O2 requirements at home.  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  Effusion most concerning for potential malignancy, doubt infection given afebrile, only mild leukocytosis test negative, pleural fluid analysis negative Gram stain.  Status post 1.2 L removed by IR on 7/22.  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  Very concerning for malignancy given patient's smoking history and 28-month history of worsening cough not responsive to medication regimen.  Status post -Gram stain negative, no malignant cells found on cytology of pleural fluid -Given noncontributory pleural fluid analysis will reconsult pulmonology for consideration of bronchoscopy to obtain tissue for diagnosis -Encourage incentive spirometry  Leukocytosis, mild  Remains afebrile white count is up trended slightly from 14.7-15.7.  Suspect this morning likely stress-induced however patient is at increased risk for postobstructive related to mass HTN closely monitor for fever curve and potential start of antibiotics  Hyponatremia, suspect hypovolemic given urine sodium, and low normal serum osmolality -Trial IV fluids -Repeat BMP in a.m.  Normocytic anemia, stable Likely related to chronic etiology, high suspicion for malignancy.  No signs or symptoms of blood loss -Monitor CBC  Hypothyroidism -Continue Synthroid  Hypertension SBP elevated in the 150s to 160s -Amlodipine 5 mg    Family Communication  : Stepdaughter updated at bedside  Code Status : Full  Disposition Plan  :  Patient is from home. Anticipated d/c date: 2 to 3 days.  Barriers to d/c or necessity for inpatient status:  IV fluids for  hyponatremia, still requiring O2 in the setting of persistent atelectasis, will need to consult PCCM for possible bronchoscopy  Consults  : , IR  Procedures  : IR guided thoracentesis, 7/22  DVT Prophylaxis  : SCDs  Lab Results  Component Value Date   PLT 349 01/26/2020    Diet :  Diet Order            Diet Heart Room service appropriate? Yes; Fluid consistency: Thin  Diet effective now                  Inpatient Medications Scheduled Meds: . amLODipine  5 mg Oral Daily  . budesonide (PULMICORT) nebulizer solution  0.5 mg Nebulization BID  . feeding supplement (ENSURE ENLIVE)  237 mL Oral TID BM  . guaiFENesin  600 mg Oral BID  . ipratropium-albuterol  3 mL Nebulization TID  . levothyroxine  100 mcg Oral Q M,W,F,Sa-1800   Continuous Infusions: PRN Meds:.acetaminophen **OR** acetaminophen, ALPRAZolam, chlorpheniramine-HYDROcodone, ondansetron **OR** ondansetron (ZOFRAN) IV  Antibiotics  :   Anti-infectives (From admission, onward)   None       Objective   Vitals:   01/26/20 0517 01/26/20 0732 01/26/20 1354 01/26/20 1454  BP: (!) 153/95   108/68  Pulse: 84   87  Resp: 22   18  Temp: 97.7 F (36.5 C)   98 F (36.7 C)  TempSrc: Oral   Oral  SpO2: 96% 95% 96% 90%  Weight:      Height:        SpO2: 90 % O2 Flow Rate (L/min): 2 L/min  Wt Readings from Last 3 Encounters:  01/24/20 113 kg  05/02/15 102.1 kg     Intake/Output Summary (Last 24 hours) at 01/26/2020 1902 Last data filed at 01/26/2020 1105 Gross per 24 hour  Intake 480 ml  Output 150 ml  Net 330 ml    Physical Exam:     Awake Alert, Oriented X 3, Normal affect No new F.N deficits,  [place].AT, Normal respiratory effort on 3 L, diminished breath sounds in right lung, wheezing heard, rhonchorous crackles at bases RRR,No Gallops,Rubs or new Murmurs,  +ve B.Sounds, Abd Soft, No tenderness, No rebound, guarding or rigidity. No Cyanosis, No new Rash or bruise     I have personally  reviewed the following:   Data Reviewed:  CBC Recent Labs  Lab 01/24/20 1359 01/25/20 0514 01/26/20 0510  WBC 14.7* 15.7* 13.2*  HGB 12.5* 11.6* 11.1*  HCT 38.8* 36.5* 34.9*  PLT 317 328 349  MCV 96.5 96.3 97.2  MCH 31.1 30.6 30.9  MCHC 32.2 31.8 31.8  RDW 14.9 14.9 14.9  LYMPHSABS 1.1  --   --   MONOABS 1.1*  --   --   EOSABS 0.1  --   --   BASOSABS 0.1  --   --     Chemistries  Recent Labs  Lab 01/24/20 1359 01/25/20 0514 01/26/20 0510  NA 133* 133* 130*  K 3.8 3.7 3.6  CL 96* 97* 94*  CO2 27 26 24   GLUCOSE 107* 105* 113*  BUN 13 14 13   CREATININE 0.90 0.82 0.72  CALCIUM 8.5* 8.6* 8.4*  AST 22  --   --   ALT 20  --   --   ALKPHOS 85  --   --   BILITOT 1.1  --   --    ------------------------------------------------------------------------------------------------------------------ No results for input(s):  CHOL, HDL, LDLCALC, TRIG, CHOLHDL, LDLDIRECT in the last 72 hours.  No results found for: HGBA1C ------------------------------------------------------------------------------------------------------------------ No results for input(s): TSH, T4TOTAL, T3FREE, THYROIDAB in the last 72 hours.  Invalid input(s): FREET3 ------------------------------------------------------------------------------------------------------------------ No results for input(s): VITAMINB12, FOLATE, FERRITIN, TIBC, IRON, RETICCTPCT in the last 72 hours.  Coagulation profile No results for input(s): INR, PROTIME in the last 168 hours.  No results for input(s): DDIMER in the last 72 hours.  Cardiac Enzymes No results for input(s): CKMB, TROPONINI, MYOGLOBIN in the last 168 hours.  Invalid input(s): CK ------------------------------------------------------------------------------------------------------------------    Component Value Date/Time   BNP 50.4 01/24/2020 1359    Micro Results Recent Results (from the past 240 hour(s))  SARS Coronavirus 2 by RT PCR (hospital order,  performed in [facility] hospital lab) Nasopharyngeal Nasopharyngeal Swab     Status: None   Collection Time: 01/24/20  8:00 PM   Specimen: Nasopharyngeal Swab  Result Value Ref Range Status   SARS Coronavirus 2 NEGATIVE NEGATIVE Final    Comment: (NOTE) SARS-CoV-2 target nucleic acids are NOT DETECTED.  The SARS-CoV-2 RNA is generally detectable in upper and lower respiratory specimens during the acute phase of infection. The lowest concentration of SARS-CoV-2 viral copies this assay can detect is 250 copies / mL. A negative result does not preclude SARS-CoV-2 infection and should not be used as the sole basis for treatment or other patient management decisions.  A negative result may occur with improper specimen collection / handling, submission of specimen other than nasopharyngeal swab, presence of viral mutation(s) within the areas targeted by this assay, and inadequate number of viral copies (<250 copies / mL). A negative result must be combined with clinical observations, patient history, and epidemiological information.  Fact Sheet for Patients:   StrictlyIdeas.no  Fact Sheet for Healthcare Providers: BankingDealers.co.za  This test is not yet approved or  cleared by the [place] FDA and has been authorized for detection and/or diagnosis of SARS-CoV-2 by FDA under an Emergency Use Authorization (EUA).  This EUA will remain in effect (meaning this test can be used) for the duration of the COVID-19 declaration under Section 564(b)(1) of the Act, 21 U.S.C. section 360bbb-3(b)(1), unless the authorization is terminated or revoked sooner.  Performed at [facility], [place] [address], [place], [place] [address]   Culture, body fluid-bottle     Status: None (Preliminary result)   Collection Time: 01/25/20  1:07 PM   Specimen: Pleura  Result Value Ref Range Status   Specimen Description PLEURAL  Final    Special Requests BOTTLES DRAWN AEROBIC AND ANAEROBIC  Final   Culture   Final    NO GROWTH < 12 HOURS Performed at [facility], [place] [address], [place], [place] [address]    Report Status PENDING  Incomplete  Gram stain     Status: None   Collection Time: 01/25/20  1:07 PM   Specimen: Pleura  Result Value Ref Range Status   Specimen Description PLEURAL  Final   Special Requests NONE  Final   Gram Stain   Final    RARE WBC PRESENT, PREDOMINANTLY MONONUCLEAR NO ORGANISMS SEEN Performed at [facility], [place] [address], [place], [place] [address]    Report Status 01/26/2020 FINAL  Final    Radiology Reports DG Chest 1 View  Result Date: 01/25/2020 CLINICAL DATA:  Status post right-sided thoracentesis. EXAM: CHEST  1 VIEW COMPARISON:  Ultrasound 01/25/2020. Chest x-ray 01/24/2020. Chest CT 01/24/2020. FINDINGS: Mediastinum  is stable. Persistent right lower lobe atelectasis/consolidation. Reference is made to recent CT report for discussion of underlying mass. Small right pleural effusion remains after pneumothorax. No pneumothorax. Persistent left base atelectasis. Reference is made to CT report for discussion of left rib lesion. IMPRESSION: 1. No evidence of pneumothorax post thoracentesis. Small residual right pleural effusion. No pneumothorax 2. Persistent right lower lobe atelectasis/consolidation. Reference is made to recent CT report for discussion of underlying mass. Reference is made to CT report for discussion of left rib lesion. 2.  Persistent left base atelectasis. Electronically Signed   By: Marcello Moores  Register   On: 01/25/2020 13:33   DG Chest 2 View  Result Date: 01/24/2020 CLINICAL DATA:  Short of breath. EXAM: CHEST - 2 VIEW COMPARISON:  None FINDINGS: Mild cardiac enlargement. Small left pleural effusion. Significantly diminished aeration to the right lower lobe is favored to represent a combination of pleural effusion, atelectasis and airspace consolidation. No  airspace opacities identified within the left lung. Remote right lower posterior rib deformities. IMPRESSION: Diminished aeration to the right lower lobe likely due to pleural effusion, atelectasis and airspace consolidation. Underlying mass lesion would be difficult to exclude. Consider further evaluation with CT of the chest with contrast material. Electronically Signed   By: Kerby Moors M.D.   On: 01/24/2020 11:59   CT Angio Chest PE W and/or Wo Contrast  Result Date: 01/24/2020 CLINICAL DATA:  Chest pain and shortness of breath over the last 2 months. EXAM: CT ANGIOGRAPHY CHEST WITH CONTRAST TECHNIQUE: Multidetector CT imaging of the chest was performed using the standard protocol during bolus administration of intravenous contrast. Multiplanar CT image reconstructions and MIPs were obtained to evaluate the vascular anatomy. CONTRAST:  128mL OMNIPAQUE IOHEXOL 350 MG/ML SOLN COMPARISON:  Chest radiography same day FINDINGS: Cardiovascular: Pulmonary arterial opacification is good. No visible pulmonary emboli. There is aortic atherosclerosis without aneurysm or dissection. Heart size is normal. Coronary artery calcification is noted. Mediastinum/Nodes: Suspicion of a right infrahilar to hilar mass with right hilar adenopathy and right paratracheal adenopathy. Lungs/Pleura: As above, there is suspicion of a right hilar to infrahilar mass, possibly on the order of 4-5 cm in size, with right hilar and paratracheal adenopathy. There is a moderate pleural effusion on the right layering dependently. Right lower lobe is collapsed. There is airspace filling and interstitial prominence in the right middle lobe and dependent right upper lobe. No focal finding on the left. Upper Abdomen: Mild enlargement of the right adrenal gland with an area measuring approximately 10 x 19 mm that could be a metastasis or adenoma. Similar prominence of the inferior limb of the left adrenal gland. Areas of low-density in the liver  are indeterminate and could be cysts, hemangiomas or metastatic disease. Musculoskeletal: Lytic destructive lesion of the left lateral fourth rib worrisome for bony metastatic disease. I do not see a spinal or sternal lesion. Review of the MIP images confirms the above findings. 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 Small bilateral adrenal masses that could be adenomas or metastases. Lytic destructive lesion of the left lateral fourth rib highly likely to be a metastatic lesion. Moderate size right effusion with dependent atelectasis. Density in the adjacent right middle lobe and right upper lobe could be atelectasis, pneumonia or interstitial spread of carcinoma. Aortic Atherosclerosis (ICD10-I70.0). Coronary artery calcification also present. Electronically Signed   By: Nelson Chimes M.D.   On: 01/24/2020 17:15   US THORACENTESIS ASP  PLEURAL SPACE W/IMG GUIDE  Result Date: 01/25/2020 INDICATION: Patient with history of right infrahilar hilar mass seen on CT a chest 01/24/2020, cough, dyspnea, and right pleural effusion. Request made for diagnostic and therapeutic right thoracentesis. EXAM: ULTRASOUND GUIDED DIAGNOSTIC AND THERAPEUTIC RIGHT THORACENTESIS MEDICATIONS: 15 mL 1% lidocaine COMPLICATIONS: None immediate. PROCEDURE: An ultrasound guided thoracentesis was thoroughly discussed with the patient and questions answered. The benefits, risks, alternatives and complications were also discussed. The patient understands and wishes to proceed with the procedure. Written consent was obtained. Ultrasound was performed to localize and mark an adequate pocket of fluid in the right chest. The area was then prepped and draped in the normal sterile fashion. 1% Lidocaine was used for local anesthesia. Under ultrasound guidance a 19 gauge, 7-cm, Yueh catheter was introduced by Dr. Pascal Lux. Thoracentesis was performed. The catheter was removed and a  dressing applied. FINDINGS: A total of approximately 1.2 L of hazy amber fluid was removed. Samples were sent to the laboratory as requested by the clinical team. IMPRESSION: Successful ultrasound guided right thoracentesis yielding 1.2 L of pleural fluid. Read by: Earley Abide, PA-C Electronically Signed   By: Sandi Mariscal M.D.   On: 01/25/2020 13:36     Time Spent in minutes  30     Desiree Hane M.D on 01/26/2020 at 7:02 PM  To page go to [email or web address] - password [facility]

## 2020-01-27 ENCOUNTER — Inpatient Hospital Stay (HOSPITAL_COMMUNITY): Payer: Medicare HMO

## 2020-01-27 DIAGNOSIS — R0602 Shortness of breath: Secondary | ICD-10-CM | POA: Diagnosis not present

## 2020-01-27 DIAGNOSIS — E8809 Other disorders of plasma-protein metabolism, not elsewhere classified: Secondary | ICD-10-CM | POA: Diagnosis not present

## 2020-01-27 DIAGNOSIS — J9601 Acute respiratory failure with hypoxia: Secondary | ICD-10-CM | POA: Diagnosis not present

## 2020-01-27 DIAGNOSIS — R918 Other nonspecific abnormal finding of lung field: Secondary | ICD-10-CM | POA: Diagnosis not present

## 2020-01-27 LAB — CBC
HCT: 35.9 % — ABNORMAL LOW (ref 39.0–52.0)
Hemoglobin: 11.4 g/dL — ABNORMAL LOW (ref 13.0–17.0)
MCH: 30.8 pg (ref 26.0–34.0)
MCHC: 31.8 g/dL (ref 30.0–36.0)
MCV: 97 fL (ref 80.0–100.0)
Platelets: 397 10*3/uL (ref 150–400)
RBC: 3.7 MIL/uL — ABNORMAL LOW (ref 4.22–5.81)
RDW: 14.8 % (ref 11.5–15.5)
WBC: 12.6 10*3/uL — ABNORMAL HIGH (ref 4.0–10.5)
nRBC: 0 % (ref 0.0–0.2)

## 2020-01-27 LAB — BASIC METABOLIC PANEL
Anion gap: 8 (ref 5–15)
BUN: 11 mg/dL (ref 8–23)
CO2: 30 mmol/L (ref 22–32)
Calcium: 8.5 mg/dL — ABNORMAL LOW (ref 8.9–10.3)
Chloride: 94 mmol/L — ABNORMAL LOW (ref 98–111)
Creatinine, Ser: 0.74 mg/dL (ref 0.61–1.24)
GFR calc Af Amer: >60 mL/min (ref >60)
GFR calc non Af Amer: >60 mL/min (ref >60)
Glucose, Bld: 126 mg/dL — ABNORMAL HIGH (ref 70–99)
Potassium: 3.6 mmol/L (ref 3.5–5.1)
Sodium: 132 mmol/L — ABNORMAL LOW (ref 135–145)

## 2020-01-27 LAB — TSH: TSH: 1.423 u[IU]/mL (ref 0.350–4.500)

## 2020-01-27 MED ORDER — SODIUM CHLORIDE 0.9 % IV SOLN: INTRAVENOUS | Status: AC

## 2020-01-27 MED ORDER — IPRATROPIUM-ALBUTEROL 0.5-2.5 (3) MG/3ML IN SOLN
RESPIRATORY_TRACT | Status: AC
  Filled 2020-01-27: qty 3

## 2020-01-27 MED ORDER — AMLODIPINE BESYLATE 10 MG PO TABS
10.0000 mg | ORAL_TABLET | Freq: Every day | ORAL | Status: DC
  Administered 2020-01-27 – 2020-01-31 (×5): 10 mg via ORAL
  Filled 2020-01-27 (×6): qty 1

## 2020-01-27 MED ORDER — IPRATROPIUM-ALBUTEROL 0.5-2.5 (3) MG/3ML IN SOLN
3.0000 mL | Freq: Four times a day (QID) | RESPIRATORY_TRACT | Status: DC | PRN
  Administered 2020-01-27: 3 mL via RESPIRATORY_TRACT

## 2020-01-27 NOTE — Progress Notes (Signed)
[facility]  PROGRESS NOTE  Jack Thompson WER:[id number] DOB: 02/15/1946 DOA: 01/24/2020 PCP: Vernie Shanks, MD Admit date - 01/24/2020   Admitting Physician Desiree Hane, MD  Outpatient Primary MD for the patient is Vernie Shanks, MD  LOS - 2 Brief Narrative   Jack Thompson is a 74 y.o. year old male with medical history significant for tobacco abuse (45-year smoker, quit 25 years ago), HTN, OA who presented on 01/24/2020 with worsening productive cough and dyspnea on going for the past 2 months with minimal improvement.  Patient states he was seen by his primary.  2 months ago prescribed Combivent inhaler in addition to his usual chest x-ray is coughing episodes.  He has required Combivent 4 times daily.  His cough is occasionally productive of clear sputum but typically despite multiple coughing episodes the phlegm stays stuck in his throat.  He denies any fevers, chills, no hemoptysis, no recent antibiotic use.  He reports 20 pound weight loss over the past 2 months with minimal appetite.  Day prior to admission patient states worsening dyspnea, coughing episode and very minimal improvement with inhalers.  In the ED, he was afebrile with a T-max, [29 SPO2 90% put on 2 L, blood pressure 160/88.  Covid test negative (vaccination per record March 2001).  WBC 14.7.  Hemoglobin 12.5, sodium 133, BUN 20. Showed persistent airspace consolidation.  CTA chest negative for PE, moderate right effusion infrahilar mass with metastatic peritracheal lesion, small bilateral adrenal masses represent adenoma/infectious   Subjective  Cough persists, no production.  Remembers waking up last night confused he was in the hospital.  Feels better now.  No bowel movement in several days, diminished appetite A & P   Acute hypoxic respiratory failure of multifactorial etiology including right size moderate pleural effusion with dependent atelectasis and infrahilar mass.  Currently on 3L, no O2 requirements at  home, has persistent coughing episodes and dyspnea on minimal exertion.  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  Effusion most concerning for potential malignancy, doubt infection given afebrile, only mild leukocytosis, Covid test negative, pleural fluid analysis negative Gram stain.  Status post 1.2 L removed by IR on 7/22.  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  Very concerning for malignancy given patient's smoking history and 9-month history of worsening cough not responsive to medication regimen.  Status post -Gram stain negative, no malignant cells found on cytology of pleural fluid -Given noncontributory pleural fluid analysis, plan for bronchoscopy  to obtain tissue for diagnosis -Encourage incentive spirometry  Confusion, intermittent.  Occurred over night suspect some hospital-acquired delirium.  Currently alert and oriented x4.  Given high concern for lung mass would like to rule out any intracranial involvement -CT head -Delirium precautions   Leukocytosis, mild, improving Remains afebrile, not.  Suspect this morning likely stress-induced however patient is at increased risk for postobstructive related to mass  closely monitor for fever curve and potential start of antibiotics  Hyponatremia, suspect hypovolemic given urine sodium, and low normal serum osmolality improving with IV fluids -Continue IV fluids, monitor BMP   Normocytic anemia, stable Likely related to chronic etiology, high suspicion for malignancy.  No  signs or symptoms of blood  loss -Monitor CBC  Hypothyroidism -Check TSH -Continue Synthroid  Hypertension SBP elevated in the 150s to 160s -Amlodipine increased to 10 mg    Family Communication  : Stepdaughter updated at bedside  Code Status : Full  Disposition Plan  :  Patient is from home. Anticipated d/c date: 2 to 3 days. Barriers to d/c or necessity for inpatient status:  IV fluids for hyponatremia, still requiring O2 in the setting of persistent atelectasis, will need to consult PCCM for possible bronchoscopy  Consults  : , IR  Procedures  : IR guided thoracentesis, 7/22  DVT Prophylaxis  : SCDs  Lab Results  Component Value Date   PLT 397 01/27/2020    Diet :  Diet Order            Diet Heart Room service appropriate? Yes; Fluid consistency: Thin  Diet effective now                  Inpatient Medications Scheduled Meds: . amLODipine  5 mg Oral Daily  . budesonide (PULMICORT) nebulizer solution  0.5 mg Nebulization BID  . feeding supplement (ENSURE ENLIVE)  237 mL Oral TID BM  . guaiFENesin  600 mg Oral BID  . ipratropium-albuterol  3 mL Nebulization TID  . levothyroxine  100 mcg Oral Q M,W,F,Sa-1800   Continuous Infusions: . sodium chloride     PRN Meds:.acetaminophen **OR** acetaminophen, ALPRAZolam, chlorpheniramine-HYDROcodone, ipratropium-albuterol, ondansetron **OR** ondansetron (ZOFRAN) IV  Antibiotics  :   Anti-infectives (From admission, onward)   None       Objective   Vitals:   01/27/20 0448 01/27/20 0751 01/27/20 0754 01/27/20 0801  BP:    (!) 164/93  Pulse:    92  Resp:    (!) 24  Temp:    97.8 F (36.6 C)  TempSrc:    Oral  SpO2: 94% 90% 90% 99%  Weight:      Height:        SpO2: 99 % O2 Flow Rate (L/min): 2 L/min  Wt Readings from Last 3 Encounters:  01/24/20 113 kg  05/02/15 102.1 kg     Intake/Output Summary (Last 24 hours) at 01/27/2020 0917 Last data filed at 01/27/2020 0801 Gross per 24 hour  Intake 1355.4 ml  Output 950 ml  Net  405.4 ml    Physical Exam:     Awake Alert, Oriented X 3, Normal affect No new F.N deficits,  [place].AT, Normal respiratory effort on 3 L, diminished breath sounds in right lung, wheezing heard, rhonchorous crackles at bases RRR,No Gallops,Rubs or new Murmurs,  +ve B.Sounds, Abd Soft, No tenderness, No rebound, guarding or rigidity. No Cyanosis, No new Rash or bruise     I have personally reviewed the following:   Data Reviewed:  CBC Recent Labs  Lab 01/24/20 1359 01/25/20 0514 01/26/20 0510 01/27/20 0556  WBC 14.7* 15.7* 13.2* 12.6*  HGB 12.5* 11.6* 11.1* 11.4*  HCT 38.8* 36.5* 34.9* 35.9*  PLT 317 328 349 397  MCV 96.5 96.3 97.2 97.0  MCH 31.1 30.6 30.9 30.8  MCHC 32.2 31.8 31.8 31.8  RDW 14.9 14.9 14.9 14.8  LYMPHSABS 1.1  --   --   --   MONOABS 1.1*  --   --   --   EOSABS 0.1  --   --   --   BASOSABS 0.1  --   --   --     Chemistries  Recent Labs  Lab  01/24/20 1359 01/25/20 0514 01/26/20 0510 01/27/20 0556  NA 133* 133* 130* 132*  K 3.8 3.7 3.6 3.6  CL 96* 97* 94* 94*  CO2 27 26 24 30   GLUCOSE 107* 105* 113* 126*  BUN 13 14 13 11   CREATININE 0.90 0.82 0.72 0.74  CALCIUM 8.5* 8.6* 8.4* 8.5*  AST 22  --   --   --   ALT 20  --   --   --   ALKPHOS 85  --   --   --   BILITOT 1.1  --   --   --    ------------------------------------------------------------------------------------------------------------------ No results for input(s): CHOL, HDL, LDLCALC, TRIG, CHOLHDL, LDLDIRECT in the last 72 hours.  No results found for: HGBA1C ------------------------------------------------------------------------------------------------------------------ No results for input(s): TSH, T4TOTAL, T3FREE, THYROIDAB in the last 72 hours.  Invalid input(s): FREET3 ------------------------------------------------------------------------------------------------------------------ No results for input(s): VITAMINB12, FOLATE, FERRITIN, TIBC, IRON, RETICCTPCT in the last 72  hours.  Coagulation profile No results for input(s): INR, PROTIME in the last 168 hours.  No results for input(s): DDIMER in the last 72 hours.  Cardiac Enzymes No results for input(s): CKMB, TROPONINI, MYOGLOBIN in the last 168 hours.  Invalid input(s): CK ------------------------------------------------------------------------------------------------------------------    Component Value Date/Time   BNP 50.4 01/24/2020 1359    Micro Results Recent Results (from the past 240 hour(s))  SARS Coronavirus 2 by RT PCR (hospital order, performed in [facility] hospital lab) Nasopharyngeal Nasopharyngeal Swab     Status: None   Collection Time: 01/24/20  8:00 PM   Specimen: Nasopharyngeal Swab  Result Value Ref Range Status   SARS Coronavirus 2 NEGATIVE NEGATIVE Final    Comment: (NOTE) SARS-CoV-2 target nucleic acids are NOT DETECTED.  The SARS-CoV-2 RNA is generally detectable in upper and lower respiratory specimens during the acute phase of infection. The lowest concentration of SARS-CoV-2 viral copies this assay can detect is 250 copies / mL. A negative result does not preclude SARS-CoV-2 infection and should not be used as the sole basis for treatment or other patient management decisions.  A negative result may occur with improper specimen collection / handling, submission of specimen other than nasopharyngeal swab, presence of viral mutation(s) within the areas targeted by this assay, and inadequate number of viral copies (<250 copies / mL). A negative result must be combined with clinical observations, patient history, and epidemiological information.  Fact Sheet for Patients:   StrictlyIdeas.no  Fact Sheet for Healthcare Providers: BankingDealers.co.za  This test is not yet approved or  cleared by the [place] FDA and has been authorized for detection and/or diagnosis of SARS-CoV-2 by FDA under an Emergency Use  Authorization (EUA).  This EUA will remain in effect (meaning this test can be used) for the duration of the COVID-19 declaration under Section 564(b)(1) of the Act, 21 U.S.C. section 360bbb-3(b)(1), unless the authorization is terminated or revoked sooner.  Performed at [facility], [place] [address] [place], [place] [address]   Culture, body fluid-bottle     Status: None (Preliminary result)   Collection Time: 01/25/20  1:07 PM   Specimen: Pleura  Result Value Ref Range Status   Specimen Description PLEURAL  Final   Special Requests BOTTLES DRAWN AEROBIC AND ANAEROBIC  Final   Culture   Final    NO GROWTH 2 DAYS Performed at [facility], [address], [place], [place] [address]    Report Status PENDING  Incomplete  Gram stain     Status:  None   Collection Time: 01/25/20  1:07 PM   Specimen: Pleura  Result Value Ref Range Status   Specimen Description PLEURAL  Final   Special Requests NONE  Final   Gram Stain   Final    RARE WBC PRESENT, PREDOMINANTLY MONONUCLEAR NO ORGANISMS SEEN Performed at [facility], [address] [place], [place] [address]    Report Status 01/26/2020 FINAL  Final    Radiology Reports DG Chest 1 View  Result Date: 01/25/2020 CLINICAL DATA:  Status post right-sided thoracentesis. EXAM: CHEST  1 VIEW COMPARISON:  Ultrasound 01/25/2020. Chest x-ray 01/24/2020. Chest CT 01/24/2020. FINDINGS: Mediastinum is stable. Persistent right lower lobe atelectasis/consolidation. Reference is made to recent CT report for discussion of underlying mass. Small right pleural effusion remains after pneumothorax. No pneumothorax. Persistent left base atelectasis. Reference is made to CT report for discussion of left rib lesion. IMPRESSION: 1. No evidence of pneumothorax post thoracentesis. Small residual right pleural effusion. No pneumothorax 2. Persistent right lower lobe atelectasis/consolidation. Reference is made to recent CT report for  discussion of underlying mass. Reference is made to CT report for discussion of left rib lesion. 2.  Persistent left base atelectasis. Electronically Signed   By: Marcello Moores  Register   On: 01/25/2020 13:33   DG Chest 2 View  Result Date: 01/24/2020 CLINICAL DATA:  Short of breath. EXAM: CHEST - 2 VIEW COMPARISON:  None FINDINGS: Mild cardiac enlargement. Small left pleural effusion. Significantly diminished aeration to the right lower lobe is favored to represent a combination of pleural effusion, atelectasis and airspace consolidation. No airspace opacities identified within the left lung. Remote right lower posterior rib deformities. IMPRESSION: Diminished aeration to the right lower lobe likely due to pleural effusion, atelectasis and airspace consolidation. Underlying mass lesion would be difficult to exclude. Consider further evaluation with CT of the chest with contrast material. Electronically Signed   By: Kerby Moors M.D.   On: 01/24/2020 11:59   CT Angio Chest PE W and/or Wo Contrast  Result Date: 01/24/2020 CLINICAL DATA:  Chest pain and shortness of breath over the last 2 months. EXAM: CT ANGIOGRAPHY CHEST WITH CONTRAST TECHNIQUE: Multidetector CT imaging of the chest was performed using the standard protocol during bolus administration of intravenous contrast. Multiplanar CT image reconstructions and MIPs were obtained to evaluate the vascular anatomy. CONTRAST:  11mL OMNIPAQUE IOHEXOL 350 MG/ML SOLN COMPARISON:  Chest radiography same day FINDINGS: Cardiovascular: Pulmonary arterial opacification is good. No visible pulmonary emboli. There is aortic atherosclerosis without aneurysm or dissection. Heart size is normal. Coronary artery calcification is noted. Mediastinum/Nodes: Suspicion of a right infrahilar to hilar mass with right hilar adenopathy and right paratracheal adenopathy. Lungs/Pleura: As above, there is suspicion of a right hilar to infrahilar mass, possibly on the order of 4-5 cm  in size, with right hilar and paratracheal adenopathy. There is a moderate pleural effusion on the right layering dependently. Right lower lobe is collapsed. There is airspace filling and interstitial prominence in the right middle lobe and dependent right upper lobe. No focal finding on the left. Upper Abdomen: Mild enlargement of the right adrenal gland with an area measuring approximately 10 x 19 mm that could be a metastasis or adenoma. Similar prominence of the inferior limb of the left adrenal gland. Areas of low-density in the liver are indeterminate and could be cysts, hemangiomas or metastatic disease. Musculoskeletal: Lytic destructive lesion of the left lateral fourth rib worrisome for bony metastatic disease. I do not see  a spinal or sternal lesion. Review of the MIP images confirms the above findings. 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 Small bilateral adrenal masses that could be adenomas or metastases. Lytic destructive lesion of the left lateral fourth rib highly likely to be a metastatic lesion. Moderate size right effusion with dependent atelectasis. Density in the adjacent right middle lobe and right upper lobe could be atelectasis, pneumonia or interstitial spread of carcinoma. Aortic Atherosclerosis (ICD10-I70.0). Coronary artery calcification also present. Electronically Signed   By: Nelson Chimes M.D.   On: 01/24/2020 17:15   US THORACENTESIS ASP PLEURAL SPACE W/IMG GUIDE  Result Date: 01/25/2020 INDICATION: Patient with history of right infrahilar hilar mass seen on CT a chest 01/24/2020, cough, dyspnea, and right pleural effusion. Request made for diagnostic and therapeutic right thoracentesis. EXAM: ULTRASOUND GUIDED DIAGNOSTIC AND THERAPEUTIC RIGHT THORACENTESIS MEDICATIONS: 15 mL 1% lidocaine COMPLICATIONS: None immediate. PROCEDURE: An ultrasound guided thoracentesis was thoroughly discussed with the patient  and questions answered. The benefits, risks, alternatives and complications were also discussed. The patient understands and wishes to proceed with the procedure. Written consent was obtained. Ultrasound was performed to localize and mark an adequate pocket of fluid in the right chest. The area was then prepped and draped in the normal sterile fashion. 1% Lidocaine was used for local anesthesia. Under ultrasound guidance a 19 gauge, 7-cm, Yueh catheter was introduced by Dr. Pascal Lux. Thoracentesis was performed. The catheter was removed and a dressing applied. FINDINGS: A total of approximately 1.2 L of hazy amber fluid was removed. Samples were sent to the laboratory as requested by the clinical team. IMPRESSION: Successful ultrasound guided right thoracentesis yielding 1.2 L of pleural fluid. Read by: Earley Abide, PA-C Electronically Signed   By: Sandi Mariscal M.D.   On: 01/25/2020 13:36     Time Spent in minutes  30     Desiree Hane M.D on 01/27/2020 at 9:17 AM  To page go to [email or web address] - password [facility]

## 2020-01-28 ENCOUNTER — Encounter (HOSPITAL_COMMUNITY): Payer: Self-pay | Admitting: Internal Medicine

## 2020-01-28 DIAGNOSIS — R0602 Shortness of breath: Secondary | ICD-10-CM | POA: Diagnosis not present

## 2020-01-28 DIAGNOSIS — R05 Cough: Secondary | ICD-10-CM | POA: Diagnosis not present

## 2020-01-28 DIAGNOSIS — R918 Other nonspecific abnormal finding of lung field: Secondary | ICD-10-CM | POA: Diagnosis not present

## 2020-01-28 DIAGNOSIS — J9601 Acute respiratory failure with hypoxia: Principal | ICD-10-CM

## 2020-01-28 LAB — BASIC METABOLIC PANEL
Anion gap: 8 (ref 5–15)
BUN: 8 mg/dL (ref 8–23)
CO2: 30 mmol/L (ref 22–32)
Calcium: 8.4 mg/dL — ABNORMAL LOW (ref 8.9–10.3)
Chloride: 97 mmol/L — ABNORMAL LOW (ref 98–111)
Creatinine, Ser: 0.67 mg/dL (ref 0.61–1.24)
GFR calc Af Amer: >60 mL/min (ref >60)
GFR calc non Af Amer: >60 mL/min (ref >60)
Glucose, Bld: 111 mg/dL — ABNORMAL HIGH (ref 70–99)
Potassium: 4.1 mmol/L (ref 3.5–5.1)
Sodium: 135 mmol/L (ref 135–145)

## 2020-01-28 LAB — CBC
HCT: 33.7 % — ABNORMAL LOW (ref 39.0–52.0)
Hemoglobin: 10.6 g/dL — ABNORMAL LOW (ref 13.0–17.0)
MCH: 30.4 pg (ref 26.0–34.0)
MCHC: 31.5 g/dL (ref 30.0–36.0)
MCV: 96.6 fL (ref 80.0–100.0)
Platelets: 407 10*3/uL — ABNORMAL HIGH (ref 150–400)
RBC: 3.49 MIL/uL — ABNORMAL LOW (ref 4.22–5.81)
RDW: 15 % (ref 11.5–15.5)
WBC: 11.3 10*3/uL — ABNORMAL HIGH (ref 4.0–10.5)
nRBC: 0 % (ref 0.0–0.2)

## 2020-01-28 MED ORDER — BOOST / RESOURCE BREEZE PO LIQD CUSTOM
1.0000 | Freq: Three times a day (TID) | ORAL | Status: DC
  Administered 2020-01-28 – 2020-01-31 (×6): 1 via ORAL

## 2020-01-28 MED ORDER — ALBUTEROL SULFATE HFA 108 (90 BASE) MCG/ACT IN AERS
1.0000 | INHALATION_SPRAY | Freq: Four times a day (QID) | RESPIRATORY_TRACT | Status: DC | PRN
  Administered 2020-01-28: 2 via RESPIRATORY_TRACT
  Administered 2020-01-29: 6 via RESPIRATORY_TRACT
  Filled 2020-01-28: qty 6.7

## 2020-01-28 MED ORDER — MELATONIN 3 MG PO TABS
3.0000 mg | ORAL_TABLET | Freq: Every evening | ORAL | Status: DC | PRN
  Administered 2020-01-28 – 2020-01-30 (×2): 3 mg via ORAL
  Filled 2020-01-28 (×3): qty 1

## 2020-01-28 NOTE — Consult Note (Signed)
NAME:  Jack Thompson, MRN:  [id number], DOB:  [birth year], LOS: 3 ADMISSION DATE:  01/24/2020, CONSULTATION DATE: 01/28/2020 REFERRING MD:  Lisbeth Ply MD, CHIEF COMPLAINT: Lung mass  Brief History   74 year old ex-smoker with hypertension, OSA presenting with cough, dyspnea, right lung mass with pleural effusion Underwent thoracentesis with negative cytology.  PCCM consulted for lung biopsy  Past Medical History    has a past medical history of Arthritis, Hypertension, Hypothyroidism, and Subcutaneous cysts, generalized.   45-pack-year smoker quit in 2018.  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  Pleural fluid 01/25/2020-cell count 1468, 62% lymphs No malignancy on cytology  Micro Data:    Antimicrobials:    Interim history/subjective:    Objective   Blood pressure 124/70, pulse 79, temperature 98.3 F (36.8 C), resp. rate 19, height 6' (1.829 m), weight 113 kg, SpO2 98 %.        Intake/Output Summary (Last 24 hours) at 01/28/2020 1143 Last data filed at 01/28/2020 0750 Gross per 24 hour  Intake 240 ml  Output 1400 ml  Net -1160 ml   Filed Weights   01/24/20 2044  Weight: 113 kg    Examination: Blood pressure 124/70, pulse 79, temperature 98.3 F (36.8 C), resp. rate 19, height 6' (1.829 m), weight 113 kg, SpO2 98 %. Gen:      No acute distress HEENT:  EOMI, sclera anicteric Neck:     No masses; no thyromegaly Lungs:    Clear to auscultation bilaterally; normal respiratory effort CV:         Regular rate and rhythm; no murmurs Abd:      + bowel sounds; soft, non-tender; no palpable masses, no distension Ext:    No edema; adequate peripheral perfusion Skin:      Warm and dry; no rash Neuro: alert and oriented x 3 Psych: normal  mood and affect  Resolved Hospital Problem list     Assessment & Plan:  Lung mass with pleural effusion Suspicious for malignancy Pleural fluid cytology is negative hence will proceed with bronchoscopy with endobronchial ultrasound and biopsy We will tentatively plan for tomorrow based on Endo availability Keep n.p.o. past midnight  Risk/benefit discussed in detail with patient.  He has made an informed choice to proceed.    Best practice:  Per primary team  Labs   CBC: Recent Labs  Lab 01/24/20 1359 01/25/20 0514 01/26/20 0510 01/27/20 0556 01/28/20 0635  WBC 14.7* 15.7* 13.2* 12.6* 11.3*  NEUTROABS 12.3*  --   --   --   --   HGB 12.5* 11.6* 11.1* 11.4* 10.6*  HCT 38.8* 36.5* 34.9* 35.9* 33.7*  MCV 96.5 96.3 97.2 97.0 96.6  PLT 317 328 349 397 407*    Basic Metabolic Panel: Recent Labs  Lab 01/24/20 1359 01/25/20 0514 01/26/20 0510 01/27/20 0556 01/28/20 0635  NA 133* 133* 130* 132* 135  K 3.8 3.7 3.6 3.6 4.1  CL 96* 97* 94* 94* 97*  CO2 27 26 24 30 30   GLUCOSE 107* 105* 113* 126* 111*  BUN 13 14 13 11 8   CREATININE 0.90 0.82 0.72 0.74 0.67  CALCIUM 8.5* 8.6* 8.4* 8.5* 8.4*  PHOS  --  4.0  --   --   --    GFR: Estimated Creatinine Clearance: 108.4 mL/min (  by C-G formula based on SCr of 0.67 mg/dL). Recent Labs  Lab 01/25/20 0514 01/26/20 0510 01/27/20 0556 01/28/20 0635  WBC 15.7* 13.2* 12.6* 11.3*    Liver Function Tests: Recent Labs  Lab 01/24/20 1359 01/25/20 0514  AST 22  --   ALT 20  --   ALKPHOS 85  --   BILITOT 1.1  --   PROT 7.8  --   ALBUMIN 2.8* 2.6*   No results for input(s): LIPASE, AMYLASE in the last 168 hours. No results for input(s): AMMONIA in the last 168 hours.  ABG No results found for: PHART, PCO2ART, PO2ART, HCO3, TCO2, ACIDBASEDEF, O2SAT   Coagulation Profile: No results for input(s): INR, PROTIME in the last 168 hours.  Cardiac Enzymes: No results for input(s): CKTOTAL, CKMB, CKMBINDEX, TROPONINI in the last  168 hours.  HbA1C: No results found for: HGBA1C  CBG: No results for input(s): GLUCAP in the last 168 hours.  Review of Systems:   REVIEW OF SYSTEMS:   All negative; except for those that are bolded, which indicate positives.  Constitutional: weight loss, weight gain, night sweats, fevers, chills, fatigue, weakness.  HEENT: headaches, sore throat, sneezing, nasal congestion, post nasal drip, difficulty swallowing, tooth/dental problems, visual complaints, visual changes, ear aches. Neuro: difficulty with speech, weakness, numbness, ataxia. CV:  chest pain, orthopnea, PND, swelling in lower extremities, dizziness, palpitations, syncope.  Resp: cough, hemoptysis, dyspnea, wheezing. GI: heartburn, indigestion, abdominal pain, nausea, vomiting, diarrhea, constipation, change in bowel habits, loss of appetite, hematemesis, melena, hematochezia.  GU: dysuria, change in color of urine, urgency or frequency, flank pain, hematuria. MSK: joint pain or swelling, decreased range of motion. Psych: change in mood or affect, depression, anxiety, suicidal ideations, homicidal ideations. Skin: rash, itching, bruising.  Past Medical History  He,  has a past medical history of Arthritis, Hypertension, Hypothyroidism, and Subcutaneous cysts, generalized.   Surgical History    Past Surgical History:  Procedure Laterality Date  . CYST EXCISION N/A 05/02/2015   Procedure: EXCISION OF CYST RIGHT THIGH AND BUTTOCK;  Surgeon: Armandina Gemma, MD;  Location: [facility];  Service: General;  Laterality: N/A;  . TONSILLECTOMY       Social History   reports that he has been smoking. He has been smoking about 1.00 pack per day. He has never used smokeless tobacco. He reports current alcohol use. He reports that he does not use drugs.   Family History   His family history is not on file.   Allergies No Known Allergies   Home Medications  Prior to Admission medications   Medication Sig Start  Date End Date Taking? Authorizing Provider  COMBIVENT RESPIMAT 20-100 MCG/ACT AERS respimat Inhale 2 puffs into the lungs every 6 (six) hours as needed for wheezing or shortness of breath.  01/03/20  Yes [provider]  guaiFENesin (MUCINEX) 600 MG 12 hr tablet Take 600 mg by mouth 2 (two) times daily as needed for cough.   Yes [provider]  levothyroxine (SYNTHROID, LEVOTHROID) 100 MCG tablet Take 100 mcg by mouth as directed. Take on MWF & Sat   Yes [provider]  amLODipine (NORVASC) 5 MG tablet Take 5 mg by mouth daily. Patient not taking: Reported on 01/24/2020    [provider]  HYDROcodone-acetaminophen (NORCO/VICODIN) 5-325 MG tablet Take 1-2 tablets by mouth every 4 (four) hours as needed for moderate pain. Patient not taking: Reported on 01/24/2020 05/02/15   Armandina Gemma, MD  meloxicam (MOBIC) 15 MG  tablet Take 15 mg by mouth daily. Patient not taking: Reported on 01/24/2020    [provider]  naproxen sodium (ANAPROX) 220 MG tablet Take 220 mg by mouth 2 (two) times daily with a meal. Patient not taking: Reported on 01/24/2020    [provider]     Critical care time: NA   Marshell Garfinkel MD [facility] Please see [email or web address] for pager details.  01/28/2020, 12:23 PM

## 2020-01-28 NOTE — Progress Notes (Signed)
[facility]  PROGRESS NOTE  Jack Thompson FIE:[id number] DOB: 01/07/1946 DOA: 01/24/2020 PCP: Vernie Shanks, MD Admit date - 01/24/2020   Admitting Physician Desiree Hane, MD  Outpatient Primary MD for the patient is Vernie Shanks, MD  LOS - 3 Brief Narrative   Jack Thompson is a 74 y.o. year old male with medical history significant for tobacco abuse (45-year smoker, quit 25 years ago), HTN, OA who presented on 01/24/2020 with worsening productive cough and dyspnea on going for the past 2 months with minimal improvement.  Patient states he was seen by his primary.  2 months ago prescribed Combivent inhaler in addition to his usual chest x-ray is coughing episodes.  He has required Combivent 4 times daily.  His cough is occasionally productive of clear sputum but typically despite multiple coughing episodes the phlegm stays stuck in his throat.  He denies any fevers, chills, no hemoptysis, no recent antibiotic use.  He reports 20 pound weight loss over the past 2 months with minimal appetite.  Day prior to admission patient states worsening dyspnea, coughing episode and very minimal improvement with inhalers.  In the ED, he was afebrile with a T-max, [29 SPO2 90% put on 2 L, blood pressure 160/88.  Covid test negative (vaccination per record March 2001).  WBC 14.7.  Hemoglobin 12.5, sodium 133, BUN 20. Showed persistent airspace consolidation.  CTA chest negative for PE, moderate right effusion infrahilar mass with metastatic peritracheal lesion, small bilateral adrenal masses represent adenoma/infectious   Subjective  Still has nonproductive cough.  And associated shortness of breath with coughing episodes.   A & P   Acute hypoxic respiratory failure of multifactorial etiology including right size moderate pleural effusion with dependent atelectasis and infrahilar mass.  Currently on 3L, no O2 requirements at home, has persistent coughing episodes and dyspnea on minimal exertion.  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  Effusion most concerning for potential malignancy, doubt infection given afebrile, only mild leukocytosis, Covid test negative, pleural fluid analysis negative Gram stain.  Status post 1.2 L removed by IR on 7/22.  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  Very concerning for malignancy given patient's smoking history and 43-month history of worsening cough not responsive to medication regimen.  Status post -Gram stain negative, no malignant cells found on cytology of pleural fluid -Given noncontributory pleural fluid analysis, plan for bronchoscopy  to obtain tissue for diagnosis -Encourage incentive spirometry  Confusion, intermittent.  Occurred over night suspect some hospital-acquired delirium.  Currently alert and oriented x4.  CT head nonacute -Discontinue as needed Ativan started here -Melatonin for sleep -Delirium precautions  Leukocytosis, mild, continues to steadily improve Remains afebrile, not.  Suspect this  likely stress-induced however patient is at increased risk for postobstructive related to mass -Closely monitor for fever curve and potential start of antibiotics  Hyponatremia, resolved.  Suspect hypovolemic given urine sodium, and low normal serum osmolality  improved with IV fluids -Encourage oral hydration monitor BMP -Liberalize diet, dietary assisting with supplementation given diminished appetite  Normocytic anemia, stable Likely related to chronic etiology, high suspicion for malignancy.  No signs or symptoms of blood loss -Monitor  CBC  Hypothyroidism -  TSH within normal limits -Continue Synthroid  Hypertension SBP elevated in the 150s to 160s -Amlodipine increased to 10 mg -IV hydralazine as needed    Family Communication  : Stepdaughter updated at bedside on 7/24  Code Status : Full  Disposition Plan  :  Patient is from home. Anticipated d/c date: 2 to 3 days. Barriers to d/c or necessity for inpatient status:   still requiring O2 in the setting of persistent atelectasis, n.p.o. at midnight for possible bronchoscopy with biopsy on 7/26 Consults  : , IR, pulmonary  Procedures  : IR guided thoracentesis, 7/22  DVT Prophylaxis  : SCDs  Lab Results  Component Value Date   PLT 407 (H) 01/28/2020    Diet :  Diet Order            Diet regular Room service appropriate? Yes; Fluid consistency: Thin  Diet effective now                  Inpatient Medications Scheduled Meds:  amLODipine  10 mg Oral Daily   budesonide (PULMICORT) nebulizer solution  0.5 mg Nebulization BID   feeding supplement  1 Container Oral TID BM   guaiFENesin  600 mg Oral BID   ipratropium-albuterol  3 mL Nebulization TID   levothyroxine  100 mcg Oral Q M,W,F,Sa-1800   Continuous Infusions:  PRN Meds:.acetaminophen **OR** acetaminophen, albuterol, chlorpheniramine-HYDROcodone, ipratropium-albuterol, melatonin, ondansetron **OR** ondansetron (ZOFRAN) IV  Antibiotics  :   Anti-infectives (From admission, onward)   None       Objective   Vitals:   01/27/20 2140 01/28/20 0438 01/28/20 0748 01/28/20 1242  BP: (!) 142/77 124/70  (!) 160/82  Pulse: 97 79  90  Resp: 19 19  (!) 25  Temp: 98.8 F (37.1 C) 98.3 F (36.8 C)  98 F (36.7 C)  TempSrc: Oral   Oral  SpO2: 93% 98% 98% 96%  Weight:      Height:        SpO2: 96 % O2 Flow Rate (L/min): 2 L/min  Wt Readings from Last 3 Encounters:  01/24/20 113 kg  05/02/15 102.1 kg     Intake/Output Summary (Last 24 hours) at 01/28/2020 1751 Last data filed at  01/28/2020 1737 Gross per 24 hour  Intake 720 ml  Output 1800 ml  Net -1080 ml    Physical Exam:     Awake Alert, Oriented X 3, tearful affect No new F.N deficits,  [place].AT, Normal respiratory effort on 3 L, diminished breath sounds in right lung, wheezing heard, rhonchorous crackles at bases RRR,No Gallops,Rubs or new Murmurs,  +ve B.Sounds, Abd Soft, No tenderness, No rebound, guarding or rigidity. No Cyanosis, No new Rash or bruise     I have personally reviewed the following:   Data Reviewed:  CBC Recent Labs  Lab 01/24/20 1359 01/25/20 0514 01/26/20 0510 01/27/20 0556 01/28/20 0635  WBC 14.7* 15.7* 13.2* 12.6* 11.3*  HGB 12.5* 11.6* 11.1* 11.4* 10.6*  HCT 38.8* 36.5* 34.9* 35.9* 33.7*  PLT 317 328 349 397 407*  MCV 96.5 96.3 97.2 97.0 96.6  MCH 31.1 30.6 30.9 30.8 30.4  MCHC 32.2 31.8 31.8 31.8 31.5  RDW 14.9 14.9 14.9 14.8 15.0  LYMPHSABS 1.1  --   --   --   --   MONOABS 1.1*  --   --   --   --   EOSABS 0.1  --   --   --   --   BASOSABS 0.1  --   --   --   --  Chemistries  Recent Labs  Lab 01/24/20 1359 01/25/20 0514 01/26/20 0510 01/27/20 0556 01/28/20 0635  NA 133* 133* 130* 132* 135  K 3.8 3.7 3.6 3.6 4.1  CL 96* 97* 94* 94* 97*  CO2 27 26 24 30 30   GLUCOSE 107* 105* 113* 126* 111*  BUN 13 14 13 11 8   CREATININE 0.90 0.82 0.72 0.74 0.67  CALCIUM 8.5* 8.6* 8.4* 8.5* 8.4*  AST 22  --   --   --   --   ALT 20  --   --   --   --   ALKPHOS 85  --   --   --   --   BILITOT 1.1  --   --   --   --    ------------------------------------------------------------------------------------------------------------------ No results for input(s): CHOL, HDL, LDLCALC, TRIG, CHOLHDL, LDLDIRECT in the last 72 hours.  No results found for: HGBA1C ------------------------------------------------------------------------------------------------------------------ Recent Labs    01/27/20 0943  TSH 1.423    ------------------------------------------------------------------------------------------------------------------ No results for input(s): VITAMINB12, FOLATE, FERRITIN, TIBC, IRON, RETICCTPCT in the last 72 hours.  Coagulation profile No results for input(s): INR, PROTIME in the last 168 hours.  No results for input(s): DDIMER in the last 72 hours.  Cardiac Enzymes No results for input(s): CKMB, TROPONINI, MYOGLOBIN in the last 168 hours.  Invalid input(s): CK ------------------------------------------------------------------------------------------------------------------    Component Value Date/Time   BNP 50.4 01/24/2020 1359    Micro Results Recent Results (from the past 240 hour(s))  SARS Coronavirus 2 by RT PCR (hospital order, performed in [facility] hospital lab) Nasopharyngeal Nasopharyngeal Swab     Status: None   Collection Time: 01/24/20  8:00 PM   Specimen: Nasopharyngeal Swab  Result Value Ref Range Status   SARS Coronavirus 2 NEGATIVE NEGATIVE Final    Comment: (NOTE) SARS-CoV-2 target nucleic acids are NOT DETECTED.  The SARS-CoV-2 RNA is generally detectable in upper and lower respiratory specimens during the acute phase of infection. The lowest concentration of SARS-CoV-2 viral copies this assay can detect is 250 copies / mL. A negative result does not preclude SARS-CoV-2 infection and should not be used as the sole basis for treatment or other patient management decisions.  A negative result may occur with improper specimen collection / handling, submission of specimen other than nasopharyngeal swab, presence of viral mutation(s) within the areas targeted by this assay, and inadequate number of viral copies (<250 copies / mL). A negative result must be combined with clinical observations, patient history, and epidemiological information.  Fact Sheet for Patients:   StrictlyIdeas.no  Fact Sheet for Healthcare  Providers: BankingDealers.co.za  This test is not yet approved or  cleared by the [place] FDA and has been authorized for detection and/or diagnosis of SARS-CoV-2 by FDA under an Emergency Use Authorization (EUA).  This EUA will remain in effect (meaning this test can be used) for the duration of the COVID-19 declaration under Section 564(b)(1) of the Act, 21 U.S.C. section 360bbb-3(b)(1), unless the authorization is terminated or revoked sooner.  Performed at [facility], [place] [address], [place], [place] [address]   Culture, body fluid-bottle     Status: None (Preliminary result)   Collection Time: 01/25/20  1:07 PM   Specimen: Pleura  Result Value Ref Range Status   Specimen Description PLEURAL  Final   Special Requests BOTTLES DRAWN AEROBIC AND ANAEROBIC  Final   Culture   Final    NO GROWTH 3 DAYS Performed at [facility],  [address], [place], [place] [address]    Report Status PENDING  Incomplete  Gram stain     Status: None   Collection Time: 01/25/20  1:07 PM   Specimen: Pleura  Result Value Ref Range Status   Specimen Description PLEURAL  Final   Special Requests NONE  Final   Gram Stain   Final    RARE WBC PRESENT, PREDOMINANTLY MONONUCLEAR NO ORGANISMS SEEN Performed at [facility], [place] [address], [place], [place] [address]    Report Status 01/26/2020 FINAL  Final    Radiology Reports DG Chest 1 View  Result Date: 01/25/2020 CLINICAL DATA:  Status post right-sided thoracentesis. EXAM: CHEST  1 VIEW COMPARISON:  Ultrasound 01/25/2020. Chest x-ray 01/24/2020. Chest CT 01/24/2020. FINDINGS: Mediastinum is stable. Persistent right lower lobe atelectasis/consolidation. Reference is made to recent CT report for discussion of underlying mass. Small right pleural effusion remains after pneumothorax. No pneumothorax. Persistent left base atelectasis. Reference is made to CT report for discussion of left rib  lesion. IMPRESSION: 1. No evidence of pneumothorax post thoracentesis. Small residual right pleural effusion. No pneumothorax 2. Persistent right lower lobe atelectasis/consolidation. Reference is made to recent CT report for discussion of underlying mass. Reference is made to CT report for discussion of left rib lesion. 2.  Persistent left base atelectasis. Electronically Signed   By: Marcello Moores  Register   On: 01/25/2020 13:33   DG Chest 2 View  Result Date: 01/24/2020 CLINICAL DATA:  Short of breath. EXAM: CHEST - 2 VIEW COMPARISON:  None FINDINGS: Mild cardiac enlargement. Small left pleural effusion. Significantly diminished aeration to the right lower lobe is favored to represent a combination of pleural effusion, atelectasis and airspace consolidation. No airspace opacities identified within the left lung. Remote right lower posterior rib deformities. IMPRESSION: Diminished aeration to the right lower lobe likely due to pleural effusion, atelectasis and airspace consolidation. Underlying mass lesion would be difficult to exclude. Consider further evaluation with CT of the chest with contrast material. Electronically Signed   By: Kerby Moors M.D.   On: 01/24/2020 11:59   CT HEAD WO CONTRAST  Result Date: 01/27/2020 CLINICAL DATA:  Delirium.  Lung mass. EXAM: CT HEAD WITHOUT CONTRAST TECHNIQUE: Contiguous axial images were obtained from the base of the skull through the vertex without intravenous contrast. COMPARISON:  03/16/2011 FINDINGS: Brain: No acute cortically based infarct, intracranial hemorrhage, mass, midline shift, or extra-axial fluid collection is identified. Patchy hypodensities in the cerebral white matter bilaterally are new and nonspecific but compatible with moderate chronic small vessel ischemic disease. 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 The ventricles and sulci are within normal limits for age. Vascular: Calcified atherosclerosis at the skull base. No  hyperdense vessel. Skull: No fracture or suspicious osseous lesion. Sinuses/Orbits: Visualized paranasal sinuses and mastoid air cells are clear. Unremarkable orbits. Other: None. IMPRESSION: 1. No evidence of acute intracranial abnormality. 2. Moderate chronic small vessel ischemic disease, new from 2012. Electronically Signed   By: Logan Bores M.D.   On: 01/27/2020 11:39   CT Angio Chest PE W and/or Wo Contrast  Result Date: 01/24/2020 CLINICAL DATA:  Chest pain and shortness of breath over the last 2 months. EXAM: CT ANGIOGRAPHY CHEST WITH CONTRAST TECHNIQUE: Multidetector CT imaging of the chest was performed using the standard protocol during bolus administration of intravenous contrast. Multiplanar CT image reconstructions and MIPs were obtained to evaluate the vascular anatomy. CONTRAST:  117mL OMNIPAQUE IOHEXOL 350 MG/ML SOLN  COMPARISON:  Chest radiography same day FINDINGS: Cardiovascular: Pulmonary arterial opacification is good. No visible pulmonary emboli. There is aortic atherosclerosis without aneurysm or dissection. Heart size is normal. Coronary artery calcification is noted. Mediastinum/Nodes: Suspicion of a right infrahilar to hilar mass with right hilar adenopathy and right paratracheal adenopathy. Lungs/Pleura: As above, there is suspicion of a right hilar to infrahilar mass, possibly on the order of 4-5 cm in size, with right hilar and paratracheal adenopathy. There is a moderate pleural effusion on the right layering dependently. Right lower lobe is collapsed. There is airspace filling and interstitial prominence in the right middle lobe and dependent right upper lobe. No focal finding on the left. Upper Abdomen: Mild enlargement of the right adrenal gland with an area measuring approximately 10 x 19 mm that could be a metastasis or adenoma. Similar prominence of the inferior limb of the left adrenal gland. Areas of low-density in the liver are indeterminate and could be cysts, hemangiomas  or metastatic disease. Musculoskeletal: Lytic destructive lesion of the left lateral fourth rib worrisome for bony metastatic disease. I do not see a spinal or sternal lesion. Review of the MIP images confirms the above findings. 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 Small bilateral adrenal masses that could be adenomas or metastases. Lytic destructive lesion of the left lateral fourth rib highly likely to be a metastatic lesion. Moderate size right effusion with dependent atelectasis. Density in the adjacent right middle lobe and right upper lobe could be atelectasis, pneumonia or interstitial spread of carcinoma. Aortic Atherosclerosis (ICD10-I70.0). Coronary artery calcification also present. Electronically Signed   By: Nelson Chimes M.D.   On: 01/24/2020 17:15   DG CHEST PORT 1 VIEW  Result Date: 01/27/2020 CLINICAL DATA:  74 year old male status post thoracentesis on the right side. EXAM: PORTABLE CHEST 1 VIEW COMPARISON:  Chest x-ray 01/25/2020. FINDINGS: Persistent moderate to large right pleural effusion with atelectasis and/or consolidation in the right lung base. Left lung is clear. No left pleural effusion. No pneumothorax. No evidence of pulmonary edema. Heart size appears borderline enlarged. Upper mediastinal contours are distorted by patient's rotation to the right. IMPRESSION: 1. Persistent moderate to large right pleural effusion with atelectasis and/or consolidation in the right lung base. 2. No pneumothorax. Electronically Signed   By: Vinnie Langton M.D.   On: 01/27/2020 10:50   US THORACENTESIS ASP PLEURAL SPACE W/IMG GUIDE  Result Date: 01/25/2020 INDICATION: Patient with history of right infrahilar hilar mass seen on CT a chest 01/24/2020, cough, dyspnea, and right pleural effusion. Request made for diagnostic and therapeutic right thoracentesis. EXAM: ULTRASOUND GUIDED DIAGNOSTIC AND THERAPEUTIC RIGHT  THORACENTESIS MEDICATIONS: 15 mL 1% lidocaine COMPLICATIONS: None immediate. PROCEDURE: An ultrasound guided thoracentesis was thoroughly discussed with the patient and questions answered. The benefits, risks, alternatives and complications were also discussed. The patient understands and wishes to proceed with the procedure. Written consent was obtained. Ultrasound was performed to localize and mark an adequate pocket of fluid in the right chest. The area was then prepped and draped in the normal sterile fashion. 1% Lidocaine was used for local anesthesia. Under ultrasound guidance a 19 gauge, 7-cm, Yueh catheter was introduced by Dr. Pascal Lux. Thoracentesis was performed. The catheter was removed and a dressing applied. FINDINGS: A total of approximately 1.2 L of hazy amber fluid was removed. Samples were sent to the laboratory as requested by the clinical team. IMPRESSION: Successful ultrasound guided right thoracentesis yielding 1.2  L of pleural fluid. Read by: Earley Abide, PA-C Electronically Signed   By: Sandi Mariscal M.D.   On: 01/25/2020 13:36     Time Spent in minutes  30     Desiree Hane M.D on 01/28/2020 at 5:51 PM  To page go to [email or web address] - password [facility]

## 2020-01-28 NOTE — Progress Notes (Signed)
Nutrition Follow-up  DOCUMENTATION CODES:   Obesity unspecified  INTERVENTION:   D/c Ensure Enlive po TID  Recommend liberalizing diet  Magic cup TID with meals, each supplement provides 290 kcal and 9 grams of protein  Boost Breeze po TID, each supplement provides 250 kcal and 9 grams of protein   NUTRITION DIAGNOSIS:   Inadequate oral intake related to decreased appetite (secondary to progressively worsening dyspnea and productive cough x 2-3 months) as evidenced by per patient/family report.  Ongoing  GOAL:   Patient will meet greater than or equal to 90% of their needs  Progressing  MONITOR:   Labs, I & O's, Supplement acceptance, Weight trends, PO intake  REASON FOR ASSESSMENT:   Malnutrition Screening Tool    ASSESSMENT:   74 year old male admitted for acute respiratory failure with hypoxia after presenting with progressively worse dyspnea associated with 2-3 months of productive cough, left-sided pleuritic chest pain, decreased appetite with weight loss, and recent orthopnea. Past medical history significant for osteoarthritis, HTN, hypothyroidism, and COPD.  Pt with R infrahilar mass with suspected metastatic lesions to adrenal gland, ribs, peritracheal adenopathy. Per MD, planning for bronchoscopy to obtain tissue for diagnosis given noncontributory pleural fluid analysis.   Pt noted to have intermittent confusion suspected to be some hospital-acquired delirium. Pt with continued poor appetite. Pt noted to be refusing Ensure Enlive (ordered TID).    PO Intake: 25-100% x4 recorded meals (56% average meal intake)  UOP: 1,349ml x24 hours I/O: -504.81ml since admit  Labs: Na 132 (L) Medications reviewed.   NUTRITION - FOCUSED PHYSICAL EXAM:  RD working remotely. Unable to perform at this time. Will attempt at follow-up.  Diet Order:   Diet Order            Diet NPO time specified  Diet effective ____           Diet Heart Room service appropriate?  Yes; Fluid consistency: Thin  Diet effective now                 EDUCATION NEEDS:   No education needs have been identified at this time  Skin:  Skin Assessment: Reviewed RN Assessment  Last BM:  7/24  Height:   Ht Readings from Last 1 Encounters:  01/24/20 6' (1.829 m)    Weight:   Wt Readings from Last 1 Encounters:  01/24/20 113 kg    Ideal Body Weight:  80.9 kg  BMI:  Body mass index is 33.79 kg/m.  Estimated Nutritional Needs:   Kcal:  6295-2841  Protein:  115-125  Fluid:  >/= 2.3 L    Larkin Ina, MS, RD, LDN RD pager number and weekend/on-call pager number located in [place].

## 2020-01-29 ENCOUNTER — Other Ambulatory Visit: Payer: Self-pay | Admitting: Oncology

## 2020-01-29 ENCOUNTER — Ambulatory Visit: Admit: 2020-01-29 | Payer: Medicare Other | Admitting: Radiation Oncology

## 2020-01-29 ENCOUNTER — Inpatient Hospital Stay (HOSPITAL_COMMUNITY): Payer: Medicare HMO | Admitting: Anesthesiology

## 2020-01-29 ENCOUNTER — Encounter (HOSPITAL_COMMUNITY)
Admission: EM | Disposition: A | Discharge status: Home / Self Care | Payer: Self-pay | Source: Home / Self Care | Attending: Internal Medicine

## 2020-01-29 ENCOUNTER — Encounter (HOSPITAL_COMMUNITY): Payer: Self-pay | Admitting: Internal Medicine

## 2020-01-29 ENCOUNTER — Inpatient Hospital Stay (HOSPITAL_COMMUNITY): Payer: Medicare HMO

## 2020-01-29 ENCOUNTER — Other Ambulatory Visit: Payer: Self-pay

## 2020-01-29 DIAGNOSIS — R918 Other nonspecific abnormal finding of lung field: Secondary | ICD-10-CM

## 2020-01-29 DIAGNOSIS — I10 Essential (primary) hypertension: Secondary | ICD-10-CM | POA: Diagnosis not present

## 2020-01-29 DIAGNOSIS — E871 Hypo-osmolality and hyponatremia: Secondary | ICD-10-CM | POA: Diagnosis not present

## 2020-01-29 DIAGNOSIS — R05 Cough: Secondary | ICD-10-CM | POA: Diagnosis not present

## 2020-01-29 HISTORY — PX: FINE NEEDLE ASPIRATION: SHX6590

## 2020-01-29 HISTORY — PX: VIDEO BRONCHOSCOPY: SHX5072

## 2020-01-29 HISTORY — PX: ENDOBRONCHIAL ULTRASOUND: SHX5096

## 2020-01-29 HISTORY — PX: BIOPSY: SHX5522

## 2020-01-29 LAB — CBC
HCT: 34.4 % — ABNORMAL LOW (ref 39.0–52.0)
Hemoglobin: 10.8 g/dL — ABNORMAL LOW (ref 13.0–17.0)
MCH: 30.3 pg (ref 26.0–34.0)
MCHC: 31.4 g/dL (ref 30.0–36.0)
MCV: 96.4 fL (ref 80.0–100.0)
Platelets: 452 10*3/uL — ABNORMAL HIGH (ref 150–400)
RBC: 3.57 MIL/uL — ABNORMAL LOW (ref 4.22–5.81)
RDW: 15.1 % (ref 11.5–15.5)
WBC: 12.2 10*3/uL — ABNORMAL HIGH (ref 4.0–10.5)
nRBC: 0 % (ref 0.0–0.2)

## 2020-01-29 LAB — BASIC METABOLIC PANEL
Anion gap: 10 (ref 5–15)
BUN: 9 mg/dL (ref 8–23)
CO2: 28 mmol/L (ref 22–32)
Calcium: 8.5 mg/dL — ABNORMAL LOW (ref 8.9–10.3)
Chloride: 94 mmol/L — ABNORMAL LOW (ref 98–111)
Creatinine, Ser: 0.72 mg/dL (ref 0.61–1.24)
GFR calc Af Amer: >60 mL/min (ref >60)
GFR calc non Af Amer: >60 mL/min (ref >60)
Glucose, Bld: 131 mg/dL — ABNORMAL HIGH (ref 70–99)
Potassium: 3.8 mmol/L (ref 3.5–5.1)
Sodium: 132 mmol/L — ABNORMAL LOW (ref 135–145)

## 2020-01-29 SURGERY — ENDOBRONCHIAL ULTRASOUND (EBUS)
Anesthesia: General | Laterality: Bilateral

## 2020-01-29 MED ORDER — FENTANYL CITRATE (PF) 100 MCG/2ML IJ SOLN
INTRAMUSCULAR | Status: DC | PRN
  Administered 2020-01-29: 50 ug via INTRAVENOUS

## 2020-01-29 MED ORDER — LIDOCAINE 2% (20 MG/ML) 5 ML SYRINGE
INTRAMUSCULAR | Status: DC | PRN
  Administered 2020-01-29: 40 mg via INTRAVENOUS

## 2020-01-29 MED ORDER — SODIUM CHLORIDE 0.9 % IV SOLN: INTRAVENOUS | Status: DC | PRN

## 2020-01-29 MED ORDER — LACTATED RINGERS IV SOLN: INTRAVENOUS | Status: DC | PRN

## 2020-01-29 MED ORDER — ONDANSETRON HCL 4 MG/2ML IJ SOLN
INTRAMUSCULAR | Status: DC | PRN
  Administered 2020-01-29: 4 mg via INTRAVENOUS

## 2020-01-29 MED ORDER — PROPOFOL 10 MG/ML IV BOLUS
INTRAVENOUS | Status: DC | PRN
  Administered 2020-01-29: 100 mg via INTRAVENOUS

## 2020-01-29 MED ORDER — FENTANYL CITRATE (PF) 100 MCG/2ML IJ SOLN
INTRAMUSCULAR | Status: AC
  Filled 2020-01-29: qty 2

## 2020-01-29 MED ORDER — DEXAMETHASONE SODIUM PHOSPHATE 10 MG/ML IJ SOLN
INTRAMUSCULAR | Status: DC | PRN
  Administered 2020-01-29: 6 mg via INTRAVENOUS

## 2020-01-29 MED ORDER — ALBUTEROL SULFATE HFA 108 (90 BASE) MCG/ACT IN AERS
INHALATION_SPRAY | RESPIRATORY_TRACT | Status: AC
  Filled 2020-01-29: qty 6.7

## 2020-01-29 MED ORDER — ROCURONIUM BROMIDE 10 MG/ML (PF) SYRINGE
PREFILLED_SYRINGE | INTRAVENOUS | Status: DC | PRN
  Administered 2020-01-29: 60 mg via INTRAVENOUS

## 2020-01-29 MED ORDER — SUGAMMADEX SODIUM 200 MG/2ML IV SOLN
INTRAVENOUS | Status: DC | PRN
  Administered 2020-01-29: 300 mg via INTRAVENOUS

## 2020-01-29 NOTE — Progress Notes (Signed)
   NAME:  Jack Thompson, MRN:  [id number], DOB:  [birth year], LOS: 4 ADMISSION DATE:  01/24/2020, CONSULTATION DATE: 01/28/2020 REFERRING MD:  Lisbeth Ply MD, CHIEF COMPLAINT: Lung mass  Brief History   74 year old ex-smoker with hypertension, OSA presenting with cough, dyspnea, right lung mass with pleural effusion Underwent thoracentesis with negative cytology.  PCCM consulted for lung biopsy  Past Medical History    has a past medical history of Arthritis, Hypertension, Hypothyroidism, and Subcutaneous cysts, generalized.   45-pack-year smoker quit in 2018.  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  Pleural fluid 01/25/2020-cell count 1468, 62% lymphs No malignancy on cytology  Micro Data:    Antimicrobials:    Interim history/subjective:   No new complaints. Examined in the preprocedure  Objective   Blood pressure (!) 144/72, pulse 91, temperature 97.7 F (36.5 C), temperature source Oral, resp. rate (!) 26, height 6' (1.829 m), weight (!) 111.1 kg, SpO2 95 %.        Intake/Output Summary (Last 24 hours) at 01/29/2020 1233 Last data filed at 01/29/2020 1115 Gross per 24 hour  Intake 480 ml  Output 600 ml  Net -120 ml   Filed Weights   01/24/20 2044 01/29/20 1158  Weight: 113 kg (!) 111.1 kg    Examination: Gen:      No acute distress HEENT:  EOMI, sclera anicteric Neck:     No masses; no thyromegaly Lungs:    Clear to auscultation bilaterally; normal respiratory effort CV:         Regular rate and rhythm; no murmurs Abd:      + bowel sounds; soft, non-tender; no palpable masses, no distension Ext:    No edema; adequate peripheral perfusion Skin:      Warm and dry; no rash Neuro: alert and oriented x 3 Psych: normal mood and  affect  Resolved Hospital Problem list     Assessment & Plan:  Lung mass with pleural effusion Suspicious for malignancy Pleural fluid cytology is negative hence needs bronchoscopy with endobronchial ultrasound and biopsy to make a diagnosis. Scheduled for procedure today.  Risk/benefit discussed in detail with patient.  He has made an informed choice to proceed.    Best practice:  Per primary team  Critical care time: NA   Marshell Garfinkel MD [facility] Please see [email or web address] for pager details.  01/29/2020, 12:33 PM

## 2020-01-29 NOTE — Progress Notes (Signed)
I contacted the patient this afternoon by phone. He was not willing to converse with me further after I explained that we received inpatient consult for palliative radiotherapy for a newly diagnosed lung cancer. He was just diagnosed today and said my call was too premature and that he wanted to talk to his other care team prior to discussing anything further with me. I will try him back tomorrow.    Carola Rhine, PAC

## 2020-01-29 NOTE — Progress Notes (Signed)
RN notified RT that pt has requested breathing treatment. Pt is in no acute respiratory distress at this time. RN notified pt that RT was made aware of request and will be coming shortly to administer breathing treatment.

## 2020-01-29 NOTE — Anesthesia Procedure Notes (Signed)
Procedure Name: Intubation Date/Time: 01/29/2020 1:09 PM Performed by: Lavina Hamman, CRNA Pre-anesthesia Checklist: Patient identified, Emergency Drugs available, Suction available, Patient being monitored and Timeout performed Patient Re-evaluated:Patient Re-evaluated prior to induction Oxygen Delivery Method: Circle system utilized Preoxygenation: Pre-oxygenation with 100% oxygen Induction Type: IV induction Ventilation: Mask ventilation without difficulty Laryngoscope Size: Mac and 4 Grade View: Grade II Tube type: Oral Tube size: 9.0 mm Number of attempts: 1 Airway Equipment and Method: Stylet Placement Confirmation: ETT inserted through vocal cords under direct vision,  positive ETCO2,  CO2 detector and breath sounds checked- equal and bilateral Secured at: 22 cm Tube secured with: Tape Dental Injury: Teeth and Oropharynx as per pre-operative assessment  Comments: ATOI

## 2020-01-29 NOTE — Anesthesia Preprocedure Evaluation (Addendum)
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 SARS coronavirus NEG   breath sounds clear to auscultation       Cardiovascular hypertension, (-) angina Rhythm:Regular Rate:Normal     Neuro/Psych negative neurological ROS     GI/Hepatic Neg liver ROS, hiatal hernia, GERD  Controlled,  Endo/Other  Hypothyroidism   Renal/GU negative Renal ROS     Musculoskeletal  (+) Arthritis ,   Abdominal   Peds  Hematology negative hematology ROS (+)   Anesthesia Other Findings   Reproductive/Obstetrics                            Anesthesia Physical Anesthesia Plan  ASA: III  Anesthesia Plan: General   Post-op Pain Management:    Induction: Intravenous  PONV Risk Score and Plan: 1 and Ondansetron and Dexamethasone  Airway Management Planned: Oral ETT  Additional Equipment: None  Intra-op Plan:   Post-operative Plan: Extubation in OR  Informed Consent: I have reviewed the patients History and Physical, chart, labs and discussed the procedure including the risks, benefits and alternatives for the proposed anesthesia with the patient or authorized representative who has indicated his/her understanding and acceptance.     Dental advisory given  Plan Discussed with: CRNA and Surgeon  Anesthesia Plan Comments:        Anesthesia Quick Evaluation

## 2020-01-29 NOTE — Anesthesia Postprocedure Evaluation (Addendum)
Anesthesia Post Note  Patient: Jack Thompson  Procedure(s) Performed: ENDOBRONCHIAL ULTRASOUND (Bilateral ) FINE NEEDLE ASPIRATION VIDEO BRONCHOSCOPY BIOPSY     Patient location during evaluation: PACU Anesthesia Type: General Level of consciousness: awake and alert and oriented Pain management: pain level controlled Vital Signs Assessment: post-procedure vital signs reviewed and stable Respiratory status: spontaneous breathing, nonlabored ventilation and respiratory function stable Cardiovascular status: blood pressure returned to baseline Postop Assessment: no apparent nausea or vomiting Anesthetic complications: no   No complications documented.  Last Vitals:  Vitals:   01/29/20 1535 01/29/20 1601  BP: (!) 140/117 (!) 159/85  Pulse: 98 105  Resp: (!) 29 20  Temp:  (!) 36.4 C  SpO2: 90% 94%    Last Pain:  Vitals:   01/29/20 1601  TempSrc: Oral  PainSc:                  Brennan Bailey

## 2020-01-29 NOTE — Transfer of Care (Signed)
Immediate Anesthesia Transfer of Care Note  Patient: Jack Thompson  Procedure(s) Performed: Procedure(s): ENDOBRONCHIAL ULTRASOUND (Bilateral) FINE NEEDLE ASPIRATION VIDEO BRONCHOSCOPY BIOPSY  Patient Location: PACU  Anesthesia Type:MAC  Level of Consciousness:  sedated, patient cooperative and responds to stimulation  Airway & Oxygen Therapy:Patient Spontanous Breathing and Patient connected to face mask oxgen  Post-op Assessment:  Report given to PACU RN and Post -op Vital signs reviewed and stable  Post vital signs:  Reviewed and stable  Last Vitals:  Vitals:   01/29/20 0739 01/29/20 1158  BP:  (!) 144/72  Pulse:    Resp:  (!) 26  Temp:  36.5 C  SpO2: 78% 24%    Complications: No apparent anesthesia complications

## 2020-01-29 NOTE — Progress Notes (Signed)
[facility]  PROGRESS NOTE  [name] SNK:[id number] DOB: 03/30/1946 DOA: 01/24/2020 PCP: Vernie Shanks, MD Admit date - 01/24/2020   Admitting Physician Desiree Hane, MD  Outpatient Primary MD for the patient is Vernie Shanks, MD  LOS - 4 Brief Narrative   Jack Thompson is a 74 y.o. year old male with medical history significant for tobacco abuse (45-year smoker, quit 25 years ago), HTN, OA who presented on 01/24/2020 with worsening productive cough and dyspnea on going for the past 2 months with minimal improvement.  Patient states he was seen by his primary.  2 months ago prescribed Combivent inhaler in addition to his usual chest x-ray is coughing episodes.  He has required Combivent 4 times daily.  His cough is occasionally productive of clear sputum but typically despite multiple coughing episodes the phlegm stays stuck in his throat.  He denies any fevers, chills, no hemoptysis, no recent antibiotic use.  He reports 20 pound weight loss over the past 2 months with minimal appetite.  Day prior to admission patient states worsening dyspnea, coughing episode and very minimal improvement with inhalers.  In the ED, he was afebrile with a T-max, [29 SPO2 90% put on 2 L, blood pressure 160/88.  Covid test negative (vaccination per record March 2001).  WBC 14.7.  Hemoglobin 12.5, sodium 133, BUN 20. Showed persistent airspace consolidation.  CTA chest negative for PE, moderate right effusion infrahilar mass with metastatic peritracheal lesion, small bilateral adrenal masses represent adenoma/infectious   Subjective  No events overnight. Ready for procedure. Very tired A & P   Acute hypoxic respiratory failure of multifactorial etiology including right size moderate pleural effusion with dependent atelectasis and infrahilar mass, stable .  Stable on 3L, increased O2 requirements particularly during coughing episodes. Repeat CXR already shows re-accumulation of R pleural effusion.  Doubt infection given afebrile and leukocytosis downtrending. 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  Very concerning for malignancy given patient's smoking history and 28-month history of worsening cough not responsive to medication regimen.  Pleural fluid cytology negative. No infection on fluid analysis -Given noncontributory pleural fluid analysis, plan for bronchoscopy  to obtain tissue for diagnosis -Encourage incentive spirometry  Confusion, intermittent.  Resolved after discontinuing PRN ativan at night.  Currently alert and oriented x4.  CT head nonacute -Melatonin for sleep -Delirium precautions  Leukocytosis, mild, continues to steadily improve Remains afebrile.  Suspect this  likely stress-induced however patient is at increased risk for postobstructive related to mass -Closely monitor for fever curve and potential start of antibiotics  Hyponatremia, mild.  Suspect hypovolemic given urine sodium, and low normal serum osmolality  improved with IV fluids -Encourage oral hydration monitor BMP -Liberalized diet, dietary assisting with supplementation given diminished appetite  Normocytic anemia, stable Likely related to chronic etiology, high suspicion for malignancy.  No signs or symptoms of blood loss -Monitor CBC  Hypothyroidism -TSH within normal limits -Continue Synthroid  Hypertension SBP elevated in the 150s to 160s140s-150s, improved from prior -Amlodipine  10 mg -IV hydralazine as needed    Family Communication  : Stepdaughter updated at bedside on 7/24  Code Status : Full  Disposition Plan  :  Patient is from home. Anticipated d/c date: 2 to 3 days. Barriers to d/c or necessity for  inpatient status:   still requiring O2 in the setting of persistent atelectasis,  bronchoscopy with biopsy on 7/26 Consults  : , IR, pulmonary  Procedures  : IR guided thoracentesis, 7/22  DVT Prophylaxis  : SCDs  Lab Results  Component Value Date   PLT 452 (H) 01/29/2020    Diet :  Diet Order            Diet NPO time specified  Diet effective midnight                  Inpatient Medications Scheduled Meds: . [MAR Hold] amLODipine  10 mg Oral Daily  . [MAR Hold] budesonide (PULMICORT) nebulizer solution  0.5 mg Nebulization BID  . [MAR Hold] feeding supplement  1 Container Oral TID BM  . [MAR Hold] guaiFENesin  600 mg Oral BID  . [MAR Hold] ipratropium-albuterol  3 mL Nebulization TID  . [MAR Hold] levothyroxine  100 mcg Oral Q M,W,F,Sa-1800   Continuous Infusions:  PRN Meds:.[MAR Hold] acetaminophen **OR** [MAR Hold] acetaminophen, [MAR Hold] albuterol, [MAR Hold] chlorpheniramine-HYDROcodone, [MAR Hold] ipratropium-albuterol, [MAR Hold] melatonin, [MAR Hold] ondansetron **OR** [MAR Hold] ondansetron (ZOFRAN) IV  Antibiotics  :   Anti-infectives (From admission, onward)   None       Objective   Vitals:   01/28/20 2019 01/29/20 0403 01/29/20 0739 01/29/20 1158  BP: (!) 132/82 (!) 131/76  (!) 144/72  Pulse: 104 91    Resp: 20 20  (!) 26  Temp: 97.8 F (36.6 C) (!) 97.5 F (36.4 C)  97.7 F (36.5 C)  TempSrc: Oral Oral  Oral  SpO2: 94% 95% 93% 95%  Weight:    (!) 111.1 kg  Height:    6' (1.829 m)    SpO2: 95 % O2 Flow Rate (L/min): 2 L/min  Wt Readings from Last 3 Encounters:  01/29/20 (!) 111.1 kg  05/02/15 102.1 kg     Intake/Output Summary (Last 24 hours) at 01/29/2020 1227 Last data filed at 01/29/2020 1115 Gross per 24 hour  Intake 480 ml  Output 600 ml  Net -120 ml    Physical Exam:     Awake Alert, Oriented X 3, flat affect No new F.N deficits,  [place].AT, Normal respiratory effort on 3 L, diminished breath sounds in right lung, scant  wheezing heard, crackles t bilateral bases Pitting pedal edema bilaterally extending to ankle (sitting in bedside chair) RRR,No Gallops,Rubs or new Murmurs,  +ve B.Sounds, Abd Soft, No tenderness, No rebound, guarding or rigidity. No Cyanosis, No new Rash or bruise     I have personally reviewed the following:   Data Reviewed:  CBC Recent Labs  Lab 01/24/20 1359 01/24/20 1359 01/25/20 0514 01/26/20 0510 01/27/20 0556 01/28/20 0635 01/29/20 0556  WBC 14.7*   < > 15.7* 13.2* 12.6* 11.3* 12.2*  HGB 12.5*   < > 11.6* 11.1* 11.4* 10.6* 10.8*  HCT 38.8*   < > 36.5* 34.9* 35.9* 33.7* 34.4*  PLT 317   < > 328 349 397 407* 452*  MCV 96.5   < > 96.3 97.2 97.0 96.6 96.4  MCH 31.1   < > 30.6 30.9 30.8 30.4 30.3  MCHC 32.2   < > 31.8 31.8 31.8 31.5 31.4  RDW 14.9   < > 14.9 14.9 14.8 15.0 15.1  LYMPHSABS 1.1  --   --   --   --   --   --   MONOABS 1.1*  --   --   --   --   --   --  EOSABS 0.1  --   --   --   --   --   --   BASOSABS 0.1  --   --   --   --   --   --    < > = values in this interval not displayed.    Chemistries  Recent Labs  Lab 01/24/20 1359 01/24/20 1359 01/25/20 0514 01/26/20 0510 01/27/20 0556 01/28/20 0635 01/29/20 0556  NA 133*   < > 133* 130* 132* 135 132*  K 3.8   < > 3.7 3.6 3.6 4.1 3.8  CL 96*   < > 97* 94* 94* 97* 94*  CO2 27   < > 26 24 30 30 28   GLUCOSE 107*   < > 105* 113* 126* 111* 131*  BUN 13   < > 14 13 11 8 9   CREATININE 0.90   < > 0.82 0.72 0.74 0.67 0.72  CALCIUM 8.5*   < > 8.6* 8.4* 8.5* 8.4* 8.5*  AST 22  --   --   --   --   --   --   ALT 20  --   --   --   --   --   --   ALKPHOS 85  --   --   --   --   --   --   BILITOT 1.1  --   --   --   --   --   --    < > = values in this interval not displayed.   ------------------------------------------------------------------------------------------------------------------ No results for input(s): CHOL, HDL, LDLCALC, TRIG, CHOLHDL, LDLDIRECT in the last 72 hours.  No results found for:  HGBA1C ------------------------------------------------------------------------------------------------------------------ Recent Labs    01/27/20 0943  TSH 1.423   ------------------------------------------------------------------------------------------------------------------ No results for input(s): VITAMINB12, FOLATE, FERRITIN, TIBC, IRON, RETICCTPCT in the last 72 hours.  Coagulation profile No results for input(s): INR, PROTIME in the last 168 hours.  No results for input(s): DDIMER in the last 72 hours.  Cardiac Enzymes No results for input(s): CKMB, TROPONINI, MYOGLOBIN in the last 168 hours.  Invalid input(s): CK ------------------------------------------------------------------------------------------------------------------    Component Value Date/Time   BNP 50.4 01/24/2020 1359    Micro Results Recent Results (from the past 240 hour(s))  SARS Coronavirus 2 by RT PCR (hospital order, performed in [facility] hospital lab) Nasopharyngeal Nasopharyngeal Swab     Status: None   Collection Time: 01/24/20  8:00 PM   Specimen: Nasopharyngeal Swab  Result Value Ref Range Status   SARS Coronavirus 2 NEGATIVE NEGATIVE Final    Comment: (NOTE) SARS-CoV-2 target nucleic acids are NOT DETECTED.  The SARS-CoV-2 RNA is generally detectable in upper and lower respiratory specimens during the acute phase of infection. The lowest concentration of SARS-CoV-2 viral copies this assay can detect is 250 copies / mL. A negative result does not preclude SARS-CoV-2 infection and should not be used as the sole basis for treatment or other patient management decisions.  A negative result may occur with improper specimen collection / handling, submission of specimen other than nasopharyngeal swab, presence of viral mutation(s) within the areas targeted by this assay, and inadequate number of viral copies (<250 copies / mL). A negative result must be combined with clinical observations,  patient history, and epidemiological information.  Fact Sheet for Patients:   StrictlyIdeas.no  Fact Sheet for Healthcare Providers: BankingDealers.co.za  This test is not yet approved or  cleared by the [place] FDA and has been authorized  for detection and/or diagnosis of SARS-CoV-2 by FDA under an Emergency Use Authorization (EUA).  This EUA will remain in effect (meaning this test can be used) for the duration of the COVID-19 declaration under Section 564(b)(1) of the Act, 21 U.S.C. section 360bbb-3(b)(1), unless the authorization is terminated or revoked sooner.  Performed at [facility], [place] [address], [place], [place] [address]   Culture, body fluid-bottle     Status: None (Preliminary result)   Collection Time: 01/25/20  1:07 PM   Specimen: Pleura  Result Value Ref Range Status   Specimen Description PLEURAL  Final   Special Requests BOTTLES DRAWN AEROBIC AND ANAEROBIC  Final   Culture   Final    NO GROWTH 4 DAYS Performed at [facility], [address] [place], [place] [address]    Report Status PENDING  Incomplete  Gram stain     Status: None   Collection Time: 01/25/20  1:07 PM   Specimen: Pleura  Result Value Ref Range Status   Specimen Description PLEURAL  Final   Special Requests NONE  Final   Gram Stain   Final    RARE WBC PRESENT, PREDOMINANTLY MONONUCLEAR NO ORGANISMS SEEN Performed at [facility], [place] [address], [place], [place] [address]    Report Status 01/26/2020 FINAL  Final    Radiology Reports DG Chest 1 View  Result Date: 01/25/2020 CLINICAL DATA:  Status post right-sided thoracentesis. EXAM: CHEST  1 VIEW COMPARISON:  Ultrasound 01/25/2020. Chest x-ray 01/24/2020. Chest CT 01/24/2020. FINDINGS: Mediastinum is stable. Persistent right lower lobe atelectasis/consolidation. Reference is made to recent CT report for discussion of underlying mass. Small right  pleural effusion remains after pneumothorax. No pneumothorax. Persistent left base atelectasis. Reference is made to CT report for discussion of left rib lesion. IMPRESSION: 1. No evidence of pneumothorax post thoracentesis. Small residual right pleural effusion. No pneumothorax 2. Persistent right lower lobe atelectasis/consolidation. Reference is made to recent CT report for discussion of underlying mass. Reference is made to CT report for discussion of left rib lesion. 2.  Persistent left base atelectasis. Electronically Signed   By: Marcello Moores  Register   On: 01/25/2020 13:33   DG Chest 2 View  Result Date: 01/24/2020 CLINICAL DATA:  Short of breath. EXAM: CHEST - 2 VIEW COMPARISON:  None FINDINGS: Mild cardiac enlargement. Small left pleural effusion. Significantly diminished aeration to the right lower lobe is favored to represent a combination of pleural effusion, atelectasis and airspace consolidation. No airspace opacities identified within the left lung. Remote right lower posterior rib deformities. IMPRESSION: Diminished aeration to the right lower lobe likely due to pleural effusion, atelectasis and airspace consolidation. Underlying mass lesion would be difficult to exclude. Consider further evaluation with CT of the chest with contrast material. Electronically Signed   By: Kerby Moors M.D.   On: 01/24/2020 11:59   CT HEAD WO CONTRAST  Result Date: 01/27/2020 CLINICAL DATA:  Delirium.  Lung mass. EXAM: CT HEAD WITHOUT CONTRAST TECHNIQUE: Contiguous axial images were obtained from the base of the skull through the vertex without intravenous contrast. COMPARISON:  03/16/2011 FINDINGS: Brain: No acute cortically based infarct, intracranial hemorrhage, mass, midline shift, or extra-axial fluid collection is identified. Patchy hypodensities in the cerebral white matter bilaterally are new and nonspecific but compatible with moderate chronic small vessel ischemic disease. There is a chronic lacunar  infarct in the right basal ganglia. The ventricles and sulci are within normal limits for age. Vascular: Calcified atherosclerosis  at the skull base. No hyperdense vessel. Skull: No fracture or suspicious osseous lesion. Sinuses/Orbits: Visualized paranasal sinuses and mastoid air cells are clear. Unremarkable orbits. Other: None. IMPRESSION: 1. No evidence of acute intracranial abnormality. 2. Moderate chronic small vessel ischemic disease, new from 2012. Electronically Signed   By: Logan Bores M.D.   On: 01/27/2020 11:39   CT Angio Chest PE W and/or Wo Contrast  Result Date: 01/24/2020 CLINICAL DATA:  Chest pain and shortness of breath over the last 2 months. EXAM: CT ANGIOGRAPHY CHEST WITH CONTRAST TECHNIQUE: Multidetector CT imaging of the chest was performed using the standard protocol during bolus administration of intravenous contrast. Multiplanar CT image reconstructions and MIPs were obtained to evaluate the vascular anatomy. CONTRAST:  168mL OMNIPAQUE IOHEXOL 350 MG/ML SOLN COMPARISON:  Chest radiography same day FINDINGS: Cardiovascular: Pulmonary arterial opacification is good. No visible pulmonary emboli. There is aortic atherosclerosis without aneurysm or dissection. Heart size is normal. Coronary artery calcification is noted. Mediastinum/Nodes: Suspicion of a right infrahilar to hilar mass with right hilar adenopathy and right paratracheal adenopathy. Lungs/Pleura: As above, there is suspicion of a right hilar to infrahilar mass, possibly on the order of 4-5 cm in size, with right hilar and paratracheal adenopathy. There is a moderate pleural effusion on the right layering dependently. Right lower lobe is collapsed. There is airspace filling and interstitial prominence in the right middle lobe and dependent right upper lobe. No focal finding on the left. Upper Abdomen: Mild enlargement of the right adrenal gland with an area measuring approximately 10 x 19 mm that could be a metastasis or  adenoma. Similar prominence of the inferior limb of the left adrenal gland. Areas of low-density in the liver are indeterminate and could be cysts, hemangiomas or metastatic disease. Musculoskeletal: Lytic destructive lesion of the left lateral fourth rib worrisome for bony metastatic disease. I do not see a spinal or sternal lesion. Review of the MIP images confirms the above findings. 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 Small bilateral adrenal masses that could be adenomas or metastases. Lytic destructive lesion of the left lateral fourth rib highly likely to be a metastatic lesion. Moderate size right effusion with dependent atelectasis. Density in the adjacent right middle lobe and right upper lobe could be atelectasis, pneumonia or interstitial spread of carcinoma. Aortic Atherosclerosis (ICD10-I70.0). Coronary artery calcification also present. Electronically Signed   By: Nelson Chimes M.D.   On: 01/24/2020 17:15   DG CHEST PORT 1 VIEW  Result Date: 01/27/2020 CLINICAL DATA:  75 year old male status post thoracentesis on the right side. EXAM: PORTABLE CHEST 1 VIEW COMPARISON:  Chest x-ray 01/25/2020. FINDINGS: Persistent moderate to large right pleural effusion with atelectasis and/or consolidation in the right lung base. Left lung is clear. No left pleural effusion. No pneumothorax. No evidence of pulmonary edema. Heart size appears borderline enlarged. Upper mediastinal contours are distorted by patient's rotation to the right. IMPRESSION: 1. Persistent moderate to large right pleural effusion with atelectasis and/or consolidation in the right lung base. 2. No pneumothorax. Electronically Signed   By: Vinnie Langton M.D.   On: 01/27/2020 10:50   US THORACENTESIS ASP PLEURAL SPACE W/IMG GUIDE  Result Date: 01/25/2020 INDICATION: Patient with history of right infrahilar hilar mass seen on CT a chest 01/24/2020, cough,  dyspnea, and right pleural effusion. Request made for diagnostic and therapeutic right thoracentesis. EXAM: ULTRASOUND GUIDED DIAGNOSTIC AND THERAPEUTIC RIGHT THORACENTESIS MEDICATIONS: 15 mL 1%  lidocaine COMPLICATIONS: None immediate. PROCEDURE: An ultrasound guided thoracentesis was thoroughly discussed with the patient and questions answered. The benefits, risks, alternatives and complications were also discussed. The patient understands and wishes to proceed with the procedure. Written consent was obtained. Ultrasound was performed to localize and mark an adequate pocket of fluid in the right chest. The area was then prepped and draped in the normal sterile fashion. 1% Lidocaine was used for local anesthesia. Under ultrasound guidance a 19 gauge, 7-cm, Yueh catheter was introduced by Dr. Pascal Lux. Thoracentesis was performed. The catheter was removed and a dressing applied. FINDINGS: A total of approximately 1.2 L of hazy amber fluid was removed. Samples were sent to the laboratory as requested by the clinical team. IMPRESSION: Successful ultrasound guided right thoracentesis yielding 1.2 L of pleural fluid. Read by: Earley Abide, PA-C Electronically Signed   By: Sandi Mariscal M.D.   On: 01/25/2020 13:36     Time Spent in minutes  30     Desiree Hane M.D on 01/29/2020 at 12:27 PM  To page go to [email or web address] - password [facility]

## 2020-01-29 NOTE — Op Note (Signed)
[facility] Cardiopulmonary Patient Name: Jack Thompson Procedure Date: 01/29/2020 MRN: [id number] Attending MD: Marshell Garfinkel , MD Date of Birth: 1946-05-30 CSN: [id number] Age: 74 Admit Type: Inpatient Ethnicity: Unknown Procedure:             Bronchoscopy Indications:           Right lower lobe mass, Left lower lobe mass Providers:             Marshell Garfinkel, MD, Wynonia Sours, RN, Corie Chiquito,                         Technician Referring MD:           Medicines:              Complications:         No immediate complications. Estimated blood loss:                         Minimal Estimated Blood Loss:  Estimated blood loss was minimal. Procedure:      Pre-Anesthesia Assessment:      - A History and Physical has been performed. Patient meds and allergies       have been reviewed. The risks and benefits of the procedure and the       sedation options and risks were discussed with the patient. All       questions were answered and informed consent was obtained. Patient       identification and proposed procedure were verified prior to the       procedure in the pre-procedure area. Mental Status Examination: alert       and oriented. Airway Examination: normal oropharyngeal airway.       Respiratory Examination: clear to auscultation. CV Examination: normal.       ASA Grade Assessment: II - A patient with mild systemic disease. After       reviewing the risks and benefits, the patient was deemed in satisfactory       condition to undergo the procedure. The anesthesia plan was to use       general anesthesia. Immediately prior to administration of medications,       the patient was re-assessed for adequacy to receive sedatives. The heart       rate, respiratory rate, oxygen saturations, blood pressure, adequacy of       pulmonary ventilation, and response to care were monitored throughout       the procedure. The physical status of the patient was re-assessed after        the procedure.      After obtaining informed consent, the bronchoscope was passed under       direct vision. Throughout the procedure, the patient's blood pressure,       pulse, and oxygen saturations were monitored continuously. the BF-[id number]       ([id number] Olympus EBUS was introduced through the nose, via the       endotracheal tube (the patient was intubated for the procedure) and       advanced to the tracheobronchial tree of both lungs. Findings:      Endobronchial mass were notes in the posterior segment of the right       upper lobe and bronchus intermedius (Picture). The bronchus intermedius       masse was friable and bled easily with brushing hence biopsy of this  area was not attempted.      Washings were obtained in the right lower lobe and sent for routine       cytology and aerobic culture. The return was blood-tinged.      Brushings of a mass were obtained in the right lower lobe and in the       posterior segment of the right upper lobe with a cytology brush and sent       for routine cytology. 6 were obtained.      Endobronchial biopsies of a mass were performed in the posterior segment       of the right upper lobe using a forceps and sent for histopathology       examination. Four samples were obtained.      An endobronchial ultrasound endoscope was utilized in order to assist       with guiding the biopsy needle, better characterize the mass and better       characterize the nodule in the subcarinal area.      Transbronchial needle aspirations of a Station 7 lymph node were       performed in the subcarinal area using an Olympus ViziShot 21 gauge       needle and sent for routine cytology. The procedure was guided by       ultrasound. Transbronchial needle aspiration technique was selected       because the sampling site was not accessible using standard endoscopic       (bronchoscopic) techniques. 6 were obtained. Impression:      - Right lower lobe mass       - Left lower lobe mass Moderate Sedation:      GA Recommendation:      - Await biopsy, brushing and cytology results. Procedure Code(s):      --- Professional ---      [phone], Bronchoscopy, rigid or flexible, including fluoroscopic guidance,       when performed; with transbronchial needle aspiration biopsy(s),       trachea, main stem and/or lobar bronchus(i)      86761, 59, Bronchoscopy, rigid or flexible, including fluoroscopic       guidance, when performed; with bronchial or endobronchial biopsy(s),       single or multiple sites      31623, Bronchoscopy, rigid or flexible, including fluoroscopic guidance,       when performed; with brushing or protected brushings      31654, Bronchoscopy, rigid or flexible, including fluoroscopic guidance,       when performed; with transendoscopic endobronchial ultrasound (EBUS)       during bronchoscopic diagnostic or therapeutic intervention(s) for       peripheral lesion(s) (List separately in addition to code for primary       procedure[s]) Diagnosis Code(s):      --- Professional ---      R91.8, Other nonspecific abnormal finding of lung field CPT copyright 2019 [employer]. All rights reserved. The codes documented in this report are preliminary and upon coder review may  be revised to meet current compliance requirements. Marshell Garfinkel, MD 01/29/2020 2:52:04 PM Number of Addenda: 0 Scope In: Scope Out:

## 2020-01-30 ENCOUNTER — Telehealth: Payer: Self-pay | Admitting: Hematology and Oncology

## 2020-01-30 ENCOUNTER — Inpatient Hospital Stay (HOSPITAL_COMMUNITY): Payer: Medicare HMO

## 2020-01-30 ENCOUNTER — Ambulatory Visit
Admit: 2020-01-30 | Discharge: 2020-01-30 | Disposition: A | Discharge status: Home / Self Care | Payer: Medicare HMO | Source: Ambulatory Visit | Attending: Radiation Oncology | Admitting: Radiation Oncology

## 2020-01-30 ENCOUNTER — Encounter (HOSPITAL_COMMUNITY): Payer: Self-pay | Admitting: Pulmonary Disease

## 2020-01-30 DIAGNOSIS — E039 Hypothyroidism, unspecified: Secondary | ICD-10-CM | POA: Diagnosis not present

## 2020-01-30 DIAGNOSIS — R05 Cough: Secondary | ICD-10-CM | POA: Diagnosis not present

## 2020-01-30 DIAGNOSIS — J910 Malignant pleural effusion: Secondary | ICD-10-CM

## 2020-01-30 DIAGNOSIS — R918 Other nonspecific abnormal finding of lung field: Secondary | ICD-10-CM | POA: Diagnosis not present

## 2020-01-30 DIAGNOSIS — C3490 Malignant neoplasm of unspecified part of unspecified bronchus or lung: Secondary | ICD-10-CM

## 2020-01-30 DIAGNOSIS — C3431 Malignant neoplasm of lower lobe, right bronchus or lung: Secondary | ICD-10-CM | POA: Insufficient documentation

## 2020-01-30 DIAGNOSIS — Z510 Encounter for antineoplastic radiation therapy: Secondary | ICD-10-CM | POA: Insufficient documentation

## 2020-01-30 DIAGNOSIS — J9601 Acute respiratory failure with hypoxia: Secondary | ICD-10-CM | POA: Diagnosis not present

## 2020-01-30 LAB — CBC
HCT: 35.1 % — ABNORMAL LOW (ref 39.0–52.0)
Hemoglobin: 10.8 g/dL — ABNORMAL LOW (ref 13.0–17.0)
MCH: 30.2 pg (ref 26.0–34.0)
MCHC: 30.8 g/dL (ref 30.0–36.0)
MCV: 98 fL (ref 80.0–100.0)
Platelets: 444 10*3/uL — ABNORMAL HIGH (ref 150–400)
RBC: 3.58 MIL/uL — ABNORMAL LOW (ref 4.22–5.81)
RDW: 14.8 % (ref 11.5–15.5)
WBC: 13.5 10*3/uL — ABNORMAL HIGH (ref 4.0–10.5)
nRBC: 0 % (ref 0.0–0.2)

## 2020-01-30 LAB — CULTURE, BODY FLUID W GRAM STAIN -BOTTLE: Culture: NO GROWTH

## 2020-01-30 LAB — BASIC METABOLIC PANEL
Anion gap: 11 (ref 5–15)
BUN: 15 mg/dL (ref 8–23)
CO2: 29 mmol/L (ref 22–32)
Calcium: 8.4 mg/dL — ABNORMAL LOW (ref 8.9–10.3)
Chloride: 95 mmol/L — ABNORMAL LOW (ref 98–111)
Creatinine, Ser: 0.76 mg/dL (ref 0.61–1.24)
GFR calc Af Amer: >60 mL/min (ref >60)
GFR calc non Af Amer: >60 mL/min (ref >60)
Glucose, Bld: 165 mg/dL — ABNORMAL HIGH (ref 70–99)
Potassium: 4.4 mmol/L (ref 3.5–5.1)
Sodium: 135 mmol/L (ref 135–145)

## 2020-01-30 LAB — SURGICAL PATHOLOGY

## 2020-01-30 LAB — CYTOLOGY - NON PAP

## 2020-01-30 MED ORDER — LIDOCAINE HCL 1 % IJ SOLN
INTRAMUSCULAR | Status: AC
  Filled 2020-01-30: qty 20

## 2020-01-30 MED ORDER — GADOBUTROL 1 MMOL/ML IV SOLN
10.0000 mL | Freq: Once | INTRAVENOUS | Status: AC | PRN
  Administered 2020-01-30: 10 mL via INTRAVENOUS

## 2020-01-30 MED ORDER — POLYETHYLENE GLYCOL 3350 17 G PO PACK
17.0000 g | PACK | Freq: Every day | ORAL | Status: DC
  Administered 2020-01-30: 17 g via ORAL
  Filled 2020-01-30 (×2): qty 1

## 2020-01-30 MED ORDER — BISACODYL 5 MG PO TBEC
5.0000 mg | DELAYED_RELEASE_TABLET | Freq: Every day | ORAL | Status: DC | PRN
  Administered 2020-01-30: 5 mg via ORAL
  Filled 2020-01-30: qty 1

## 2020-01-30 NOTE — Care Management Important Message (Signed)
Important Message  Patient Details IM Letter presented to the Patient Name: Jack Thompson MRN: [id number] Date of Birth: [birth year]   Medicare Important Message Given:  Yes     Kerin Salen 01/30/2020, 1:33 PM

## 2020-01-30 NOTE — Telephone Encounter (Signed)
Mr. Rollo has been rescheduled to see Dr. Lorenso Courier to 8/5 at 1pm.

## 2020-01-30 NOTE — Progress Notes (Signed)
[facility]  PROGRESS NOTE  Jack Thompson XKG:[id number] DOB: 01-05-1946 DOA: 01/24/2020 PCP: Vernie Shanks, MD Admit date - 01/24/2020   Admitting Physician Desiree Hane, MD  Outpatient Primary MD for the patient is Vernie Shanks, MD  LOS - 5 Brief Narrative   Jack Thompson is a 74 y.o. year old male with medical history significant for tobacco abuse (45-year smoker, quit 25 years ago), HTN, OA who presented on 01/24/2020 with worsening productive cough and dyspnea on going for the past 2 months with minimal improvement.  Patient states he was seen by his primary.  2 months ago prescribed Combivent inhaler in addition to his usual chest x-ray is coughing episodes.  He has required Combivent 4 times daily.  His cough is occasionally productive of clear sputum but typically despite multiple coughing episodes the phlegm stays stuck in his throat.  He denies any fevers, chills, no hemoptysis, no recent antibiotic use.  He reports 20 pound weight loss over the past 2 months with minimal appetite.  Day prior to admission patient states worsening dyspnea, coughing episode and very minimal improvement with inhalers.  In the ED, he was afebrile with a T-max, [29 SPO2 90% put on 2 L, blood pressure 160/88.  Covid test negative (vaccination per record March 2001).  WBC 14.7.  Hemoglobin 12.5, sodium 133, BUN 20. Showed persistent airspace consolidation.  CTA chest negative for PE, moderate right effusion infrahilar mass with metastatic peritracheal lesion, small bilateral adrenal masses represent adenoma/infectious.  Hospital course: Underwent diagnostic/therapeutic thoracentesis with removal of 1.2 L of fluid from right side of lungs by IR on 7/22.  Pleural fluid cytology was unremarkable.  Underwent bronchoscopy by PCCM on 7/26 with right lower lobe and left lower lobe mass found, bronchial washings were sent for cytology, gross view concerning for non-small cell lung cancer.  Subjective    Confused overnight. Son at bedside A & P   Acute hypoxic respiratory failure of multifactorial etiology including right size moderate pleural effusion with dependent atelectasis and infrahilar mass, stable .  Stable on 3L, increased O2 requirements particularly during coughing episodes. Repeat CXR already shows re-accumulation of R pleural effusion.  Compressive atelectasis also present -Plan for IR guided thoracentesis for therapeutic removal on 7/27 --Scheduled DuoNebs, Pulmicort -Mucinex scheduled, flutter valve/incentive spirometer -Wean O2, SPO2 goal greater than 88%  NSCLC, newly diagnosed. Right infrahilar mass with suspected metastatic lesions to adrenal gland, ribs, peritracheal adenopathy.   gross findings on bronchoscopy on 7/26 concerning for NSCLC -Currently awaiting bronchial washing/cytology -Radiation oncology plans to start treatment on 7/28 after therapeutic thoracentesis -MRI brain -I discussed case with medical oncology, referral has been made to Dr. Lorenso Courier for follow-up, they will be contacting patient for appointment  Confusion, intermittent.  Resolved after discontinuing PRN ativan at night.  Currently alert and oriented x4.  CT head nonacute -Melatonin for sleep -Delirium precautions  Leukocytosis, mild, continues to steadily improve Remains afebrile.  Suspect this  likely stress-induced however patient is at increased risk for postobstructive related to mass -Closely monitor for fever curve and potential start of antibiotics  Hyponatremia, mild, resolved.  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  No signs or symptoms of blood loss -Monitor  CBC  Hypothyroidism -TSH  within normal limits -Continue Synthroid  Hypertension SBP elevated in the 150s to 160s140s-150s, improved from prior -Amlodipine  10 mg -IV hydralazine as needed    Family Communication  : Son updated at bedside on 7/27  Code Status : Full  Disposition Plan  :  Patient is from home. Anticipated d/c date: 2 to 3 days. Barriers to d/c or necessity for inpatient status:   still requiring O2 in the setting of persistent atelectasis in lung cancer Consults  : , IR, pulmonary  Procedures  : IR guided thoracentesis, 7/22, 7/27, bronchoscopy with biopsy 7/26  DVT Prophylaxis  : SCDs  Lab Results  Component Value Date   PLT 444 (H) 01/30/2020    Diet :  Diet Order            Diet regular Room service appropriate? Yes; Fluid consistency: Thin  Diet effective now                  Inpatient Medications Scheduled Meds: . amLODipine  10 mg Oral Daily  . budesonide (PULMICORT) nebulizer solution  0.5 mg Nebulization BID  . feeding supplement  1 Container Oral TID BM  . guaiFENesin  600 mg Oral BID  . ipratropium-albuterol  3 mL Nebulization TID  . levothyroxine  100 mcg Oral Q M,W,F,Sa-1800  . lidocaine       Continuous Infusions:  PRN Meds:.acetaminophen **OR** acetaminophen, albuterol, chlorpheniramine-HYDROcodone, ipratropium-albuterol, melatonin, ondansetron **OR** ondansetron (ZOFRAN) IV  Antibiotics  :   Anti-infectives (From admission, onward)   None       Objective   Vitals:   01/30/20 1113 01/30/20 1141 01/30/20 1209 01/30/20 1409  BP: (!) 138/78 128/70 125/79 (!) 117/97  Pulse:    87  Resp:    17  Temp:    98.4 F (36.9 C)  TempSrc:    Oral  SpO2:    98%  Weight:      Height:        SpO2: 98 % O2 Flow Rate (L/min): 4 L/min  Wt Readings from Last 3 Encounters:  01/29/20 (!) 111.1 kg  05/02/15 102.1 kg     Intake/Output Summary (Last 24 hours) at 01/30/2020 1634 Last data filed at 01/30/2020 1423 Gross per 24 hour   Intake 120 ml  Output --  Net 120 ml    Physical Exam:     Awake Alert, Oriented X 3, flat affect No new F.N deficits,  [place]y.AT, Normal respiratory effort on 3 L, diminished breath sounds in right lung, scant wheezing heard, crackles t bilateral bases Pitting pedal edema bilaterally extending to ankle  RRR,No Gallops,Rubs or new Murmurs,  +ve B.Sounds, Abd Soft, No tenderness, No rebound, guarding or rigidity. No Cyanosis, No new Rash or bruise     I have personally reviewed the following:   Data Reviewed:  CBC Recent Labs  Lab 01/24/20 1359 01/25/20 0514 01/26/20 0510 01/27/20 0556 01/28/20 0635 01/29/20 0556 01/30/20 0501  WBC 14.7*   < > 13.2* 12.6* 11.3* 12.2* 13.5*  HGB 12.5*   < > 11.1* 11.4* 10.6* 10.8* 10.8*  HCT 38.8*   < > 34.9* 35.9* 33.7* 34.4* 35.1*  PLT 317   < > 349 397 407* 452* 444*  MCV 96.5   < > 97.2 97.0 96.6 96.4 98.0  MCH 31.1   < > 30.9 30.8 30.4 30.3 30.2  MCHC 32.2   < > 31.8 31.8 31.5 31.4 30.8  RDW 14.9   < > 14.9 14.8 15.0  15.1 14.8  LYMPHSABS 1.1  --   --   --   --   --   --   MONOABS 1.1*  --   --   --   --   --   --   EOSABS 0.1  --   --   --   --   --   --   BASOSABS 0.1  --   --   --   --   --   --    < > = values in this interval not displayed.    Chemistries  Recent Labs  Lab 01/24/20 1359 01/25/20 0514 01/26/20 0510 01/27/20 0556 01/28/20 0635 01/29/20 0556 01/30/20 0501  NA 133*   < > 130* 132* 135 132* 135  K 3.8   < > 3.6 3.6 4.1 3.8 4.4  CL 96*   < > 94* 94* 97* 94* 95*  CO2 27   < > 24 30 30 28 29   GLUCOSE 107*   < > 113* 126* 111* 131* 165*  BUN 13   < > 13 11 8 9 15   CREATININE 0.90   < > 0.72 0.74 0.67 0.72 0.76  CALCIUM 8.5*   < > 8.4* 8.5* 8.4* 8.5* 8.4*  AST 22  --   --   --   --   --   --   ALT 20  --   --   --   --   --   --   ALKPHOS 85  --   --   --   --   --   --   BILITOT 1.1  --   --   --   --   --   --    < > = values in this interval not displayed.    ------------------------------------------------------------------------------------------------------------------ No results for input(s): CHOL, HDL, LDLCALC, TRIG, CHOLHDL, LDLDIRECT in the last 72 hours.  No results found for: HGBA1C ------------------------------------------------------------------------------------------------------------------ No results for input(s): TSH, T4TOTAL, T3FREE, THYROIDAB in the last 72 hours.  Invalid input(s): FREET3 ------------------------------------------------------------------------------------------------------------------ No results for input(s): VITAMINB12, FOLATE, FERRITIN, TIBC, IRON, RETICCTPCT in the last 72 hours.  Coagulation profile No results for input(s): INR, PROTIME in the last 168 hours.  No results for input(s): DDIMER in the last 72 hours.  Cardiac Enzymes No results for input(s): CKMB, TROPONINI, MYOGLOBIN in the last 168 hours.  Invalid input(s): CK ------------------------------------------------------------------------------------------------------------------    Component Value Date/Time   BNP 50.4 01/24/2020 1359    Micro Results Recent Results (from the past 240 hour(s))  SARS Coronavirus 2 by RT PCR (hospital order, performed in [facility] hospital lab) Nasopharyngeal Nasopharyngeal Swab     Status: None   Collection Time: 01/24/20  8:00 PM   Specimen: Nasopharyngeal Swab  Result Value Ref Range Status   SARS Coronavirus 2 NEGATIVE NEGATIVE Final    Comment: (NOTE) SARS-CoV-2 target nucleic acids are NOT DETECTED.  The SARS-CoV-2 RNA is generally detectable in upper and lower respiratory specimens during the acute phase of infection. The lowest concentration of SARS-CoV-2 viral copies this assay can detect is 250 copies / mL. A negative result does not preclude SARS-CoV-2 infection and should not be used as the sole basis for treatment or other patient management decisions.  A negative result may occur  with improper specimen collection / handling, submission of specimen other than nasopharyngeal swab, presence of viral mutation(s) within the areas targeted by this assay, and inadequate number of viral copies (<250 copies /  mL). A negative result must be combined with clinical observations, patient history, and epidemiological information.  Fact Sheet for Patients:   StrictlyIdeas.no  Fact Sheet for Healthcare Providers: BankingDealers.co.za  This test is not yet approved or  cleared by the [place] FDA and has been authorized for detection and/or diagnosis of SARS-CoV-2 by FDA under an Emergency Use Authorization (EUA).  This EUA will remain in effect (meaning this test can be used) for the duration of the COVID-19 declaration under Section 564(b)(1) of the Act, 21 U.S.C. section 360bbb-3(b)(1), unless the authorization is terminated or revoked sooner.  Performed at [facility], [place] [address], [place], [place] [address]   Culture, body fluid-bottle     Status: None   Collection Time: 01/25/20  1:07 PM   Specimen: Pleura  Result Value Ref Range Status   Specimen Description PLEURAL  Final   Special Requests BOTTLES DRAWN AEROBIC AND ANAEROBIC  Final   Culture   Final    NO GROWTH 5 DAYS Performed at [facility], [place] [address] [place], [place] [address]    Report Status 01/30/2020 FINAL  Final  Gram stain     Status: None   Collection Time: 01/25/20  1:07 PM   Specimen: Pleura  Result Value Ref Range Status   Specimen Description PLEURAL  Final   Special Requests NONE  Final   Gram Stain   Final    RARE WBC PRESENT, PREDOMINANTLY MONONUCLEAR NO ORGANISMS SEEN Performed at [facility], [place] [address] [place], [place] [address]    Report Status 01/26/2020 FINAL  Final  Culture, respiratory     Status: None (Preliminary result)   Collection Time: 01/29/20  1:12 PM   Specimen:  Bronchial Washing, Right; Respiratory  Result Value Ref Range Status   Specimen Description   Final    BRONCHIAL ALVEOLAR LAVAGE Performed at [place] [address], [place], [place] [address]    Special Requests   Final    NONE Performed at [facility], [place] [address], [place], [place] [address]    Gram Stain   Final    RARE WBC PRESENT, PREDOMINANTLY PMN NO ORGANISMS SEEN    Culture   Final    TOO YOUNG TO READ Performed at [facility], [place] [address], [place], [place] [address]    Report Status PENDING  Incomplete    Radiology Reports DG Chest 1 View  Result Date: 01/25/2020 CLINICAL DATA:  Status post right-sided thoracentesis. EXAM: CHEST  1 VIEW COMPARISON:  Ultrasound 01/25/2020. Chest x-ray 01/24/2020. Chest CT 01/24/2020. FINDINGS: Mediastinum is stable. Persistent right lower lobe atelectasis/consolidation. Reference is made to recent CT report for discussion of underlying mass. Small right pleural effusion remains after pneumothorax. No pneumothorax. Persistent left base atelectasis. Reference is made to CT report for discussion of left rib lesion. IMPRESSION: 1. No evidence of pneumothorax post thoracentesis. Small residual right pleural effusion. No pneumothorax 2. Persistent right lower lobe atelectasis/consolidation. Reference is made to recent CT report for discussion of underlying mass. Reference is made to CT report for discussion of left rib lesion. 2.  Persistent left base atelectasis. Electronically Signed   By: Marcello Moores  Register   On: 01/25/2020 13:33   DG Chest 2 View  Result Date: 01/24/2020 CLINICAL DATA:  Short of breath. EXAM: CHEST - 2 VIEW COMPARISON:  None FINDINGS: Mild cardiac enlargement. Small left pleural effusion. Significantly diminished aeration to the right lower  lobe is favored to represent a combination of pleural effusion, atelectasis and airspace consolidation. No airspace opacities identified  within the left lung. Remote right lower posterior rib deformities. IMPRESSION: Diminished aeration to the right lower lobe likely due to pleural effusion, atelectasis and airspace consolidation. Underlying mass lesion would be difficult to exclude. Consider further evaluation with CT of the chest with contrast material. Electronically Signed   By: Kerby Moors M.D.   On: 01/24/2020 11:59   CT HEAD WO CONTRAST  Result Date: 01/27/2020 CLINICAL DATA:  Delirium.  Lung mass. EXAM: CT HEAD WITHOUT CONTRAST TECHNIQUE: Contiguous axial images were obtained from the base of the skull through the vertex without intravenous contrast. COMPARISON:  03/16/2011 FINDINGS: Brain: No acute cortically based infarct, intracranial hemorrhage, mass, midline shift, or extra-axial fluid collection is identified. Patchy hypodensities in the cerebral white matter bilaterally are new and nonspecific but compatible with moderate chronic small vessel ischemic disease. 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 The ventricles and sulci are within normal limits for age. Vascular: Calcified atherosclerosis at the skull base. No hyperdense vessel. Skull: No fracture or suspicious osseous lesion. Sinuses/Orbits: Visualized paranasal sinuses and mastoid air cells are clear. Unremarkable orbits. Other: None. IMPRESSION: 1. No evidence of acute intracranial abnormality. 2. Moderate chronic small vessel ischemic disease, new from 2012. Electronically Signed   By: Logan Bores M.D.   On: 01/27/2020 11:39   CT Angio Chest PE W and/or Wo Contrast  Result Date: 01/24/2020 CLINICAL DATA:  Chest pain and shortness of breath over the last 2 months. EXAM: CT ANGIOGRAPHY CHEST WITH CONTRAST TECHNIQUE: Multidetector CT imaging of the chest was performed using the standard protocol during bolus administration of intravenous contrast. Multiplanar CT image reconstructions and MIPs were obtained to evaluate the vascular anatomy.  CONTRAST:  153mL OMNIPAQUE IOHEXOL 350 MG/ML SOLN COMPARISON:  Chest radiography same day FINDINGS: Cardiovascular: Pulmonary arterial opacification is good. No visible pulmonary emboli. There is aortic atherosclerosis without aneurysm or dissection. Heart size is normal. Coronary artery calcification is noted. Mediastinum/Nodes: Suspicion of a right infrahilar to hilar mass with right hilar adenopathy and right paratracheal adenopathy. Lungs/Pleura: As above, there is suspicion of a right hilar to infrahilar mass, possibly on the order of 4-5 cm in size, with right hilar and paratracheal adenopathy. There is a moderate pleural effusion on the right layering dependently. Right lower lobe is collapsed. There is airspace filling and interstitial prominence in the right middle lobe and dependent right upper lobe. No focal finding on the left. Upper Abdomen: Mild enlargement of the right adrenal gland with an area measuring approximately 10 x 19 mm that could be a metastasis or adenoma. Similar prominence of the inferior limb of the left adrenal gland. Areas of low-density in the liver are indeterminate and could be cysts, hemangiomas or metastatic disease. Musculoskeletal: Lytic destructive lesion of the left lateral fourth rib worrisome for bony metastatic disease. I do not see a spinal or sternal lesion. Review of the MIP images confirms the above findings. 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 Small bilateral adrenal masses that could be adenomas or metastases. Lytic destructive lesion of the left lateral fourth rib highly likely to be a metastatic lesion. Moderate size right effusion with dependent atelectasis. Density in the adjacent right middle lobe and right upper lobe could be atelectasis, pneumonia or interstitial spread of carcinoma. Aortic Atherosclerosis (ICD10-I70.0). Coronary artery calcification also present.  Electronically  Signed   By: Nelson Chimes M.D.   On: 01/24/2020 17:15   DG Chest Port 1 View  Result Date: 01/30/2020 CLINICAL DATA:  Right-sided thoracentesis. EXAM: PORTABLE CHEST 1 VIEW COMPARISON:  01/29/2020.  CT 01/24/2020. FINDINGS: Mediastinum stable. Stable cardiomegaly. Atelectasis/infiltrate right lung base. Moderate right pleural effusion. Similar findings noted on prior exam. No evidence of pneumothorax post thoracentesis. Degenerative change thoracic spine. Left fourth rib expansile lesion again noted. IMPRESSION: 1. No evidence of pneumothorax post thoracentesis. Residual right pleural effusion noted. 2.  Right base atelectasis/infiltrate again noted. 3.  Stable cardiomegaly. 4. Left fourth rib expansile lesion as previously noted on prior CT of 01/24/2020. Electronically Signed   By: Marcello Moores  Register   On: 01/30/2020 12:02   DG CHEST PORT 1 VIEW  Result Date: 01/29/2020 CLINICAL DATA:  74 year old male status post bronchoscopy today for right lower lobe mass. Bronchoscopic biopsy. EXAM: PORTABLE CHEST 1 VIEW COMPARISON:  Portable chest 01/27/2020, CT 01/24/2020. FINDINGS: Portable AP semi upright view at 1502 hours. Stable lung volumes and mediastinal contours. No pneumothorax. Continued dense right mid and lower lung opacification. Allowing for portable technique the left lung remains clear. Stable visualized osseous structures. IMPRESSION: No adverse features status post bronchoscopy. Stable opacification of the right lower lung. Electronically Signed   By: Genevie Ann M.D.   On: 01/29/2020 15:11   DG CHEST PORT 1 VIEW  Result Date: 01/27/2020 CLINICAL DATA:  74 year old male status post thoracentesis on the right side. EXAM: PORTABLE CHEST 1 VIEW COMPARISON:  Chest x-ray 01/25/2020. FINDINGS: Persistent moderate to large right pleural effusion with atelectasis and/or consolidation in the right lung base. Left lung is clear. No left pleural effusion. No pneumothorax. No evidence of pulmonary edema. Heart  size appears borderline enlarged. Upper mediastinal contours are distorted by patient's rotation to the right. IMPRESSION: 1. Persistent moderate to large right pleural effusion with atelectasis and/or consolidation in the right lung base. 2. No pneumothorax. Electronically Signed   By: Vinnie Langton M.D.   On: 01/27/2020 10:50   US THORACENTESIS ASP PLEURAL SPACE W/IMG GUIDE  Result Date: 01/30/2020 INDICATION: Patient with a history of right lung cancer and recurrent right pleural effusions. Interventional radiology asked to perform a therapeutic thoracentesis. EXAM: ULTRASOUND GUIDED THORACENTESIS MEDICATIONS: 1% lidocaine 10 mL COMPLICATIONS: None immediate. PROCEDURE: An ultrasound guided thoracentesis was thoroughly discussed with the patient and questions answered. The benefits, risks, alternatives and complications were also discussed. The patient understands and wishes to proceed with the procedure. Written consent was obtained. Ultrasound was performed to localize and mark an adequate pocket of fluid in the right chest. The area was then prepped and draped in the normal sterile fashion. 1% Lidocaine was used for local anesthesia. Under ultrasound guidance a 6 Fr Safe-T-Centesis catheter was introduced. Thoracentesis was performed. The catheter was removed and a dressing applied. FINDINGS: A total of approximately 850 mL of straw-colored fluid was removed. IMPRESSION: Successful ultrasound guided right thoracentesis yielding 850 mL of pleural fluid. Read by: Soyla Dryer, NP Electronically Signed   By: Jacqulynn Cadet M.D.   On: 01/30/2020 12:15   US THORACENTESIS ASP PLEURAL SPACE W/IMG GUIDE  Result Date: 01/25/2020 INDICATION: Patient with history of right infrahilar hilar mass seen on CT a chest 01/24/2020, cough, dyspnea, and right pleural effusion. Request made for diagnostic and therapeutic right thoracentesis. EXAM: ULTRASOUND GUIDED DIAGNOSTIC AND THERAPEUTIC RIGHT THORACENTESIS  MEDICATIONS: 15 mL 1% lidocaine COMPLICATIONS: None immediate. PROCEDURE: An ultrasound guided thoracentesis was thoroughly  discussed with the patient and questions answered. The benefits, risks, alternatives and complications were also discussed. The patient understands and wishes to proceed with the procedure. Written consent was obtained. Ultrasound was performed to localize and mark an adequate pocket of fluid in the right chest. The area was then prepped and draped in the normal sterile fashion. 1% Lidocaine was used for local anesthesia. Under ultrasound guidance a 19 gauge, 7-cm, Yueh catheter was introduced by Dr. Pascal Lux. Thoracentesis was performed. The catheter was removed and a dressing applied. FINDINGS: A total of approximately 1.2 L of hazy amber fluid was removed. Samples were sent to the laboratory as requested by the clinical team. IMPRESSION: Successful ultrasound guided right thoracentesis yielding 1.2 L of pleural fluid. Read by: Earley Abide, PA-C Electronically Signed   By: Sandi Mariscal M.D.   On: 01/25/2020 13:36     Time Spent in minutes  30     Desiree Hane M.D on 01/30/2020 at 4:34 PM  To page go to [email or web address] - password [email or web address]

## 2020-01-30 NOTE — Procedures (Signed)
PROCEDURE SUMMARY:  Successful US guided right thoracentesis. Yielded  of 850 ml of straw colored fluid. Pt tolerated procedure well. No immediate complications.  CXR ordered; no post-procedure pneumothorax  EBL < 5 mL  Theresa Duty NP 01/30/2020 12:10 PM

## 2020-01-30 NOTE — Telephone Encounter (Signed)
Received a new pt staff msg from Jack Bussing, NP for hilar mass. Jack Thompson has been scheduled to see Dr. Lorenso Courier on 7/30 at 11am. Pt is currently in the hospital. I updated Dr. Lorenso Courier and Erasmo Downer of the appt made to ensure it appears on the pt's discharge.

## 2020-01-30 NOTE — Progress Notes (Signed)
PCCM progress note  Bronchoscopy path shows squamous cell cancer Patient is already being set up for XRT and follow-up with oncology as an outpatient PCCM will sign off.  Please call with questions.  Marshell Garfinkel MD [facility] Please see [email or web address] for pager details.  01/30/2020, 7:02 PM

## 2020-01-30 NOTE — Progress Notes (Signed)
Radiation Oncology         [phone]) [phone] ________________________________  Initial Inpatient Consultation  Name: Cortlin Marano MRN: [id number]  Date of Service: 01/30/20 DOB: [birth year]  ZW:CHEN, Edwyna Shell, MD    REFERRING PHYSICIAN: Dr. Vaughan Browner  DIAGNOSIS: The primary encounter diagnosis was Shortness of breath. Diagnoses of Lung mass, Pleural effusion, Pleural effusion on right, S/P thoracentesis, Acute respiratory failure with hypoxia ([place]), Status post bronchoscopy with biopsy, Dyspnea, Malignant pleural effusion, and Non-small cell lung cancer (NSCLC) ([place]) were also pertinent to this visit.    ICD-10-CM   1. Shortness of breath  R06.02 Cytology - Non PAP;    Cytology - Non PAP;    Body fluid cell count with differential    Body fluid cell count with differential    Amylase, pleural or peritoneal fluid    Amylase, pleural or peritoneal fluid    Albumin, pleural or peritoneal fluid    Albumin, pleural or peritoneal fluid    Glucose, pleural or peritoneal fluid    Glucose, pleural or peritoneal fluid    CANCELED: Gram stain    CANCELED: Gram stain    CANCELED: Body fluid culture    CANCELED: Body fluid culture  2. Lung mass  R91.8 Cytology - Non PAP;    Cytology - Non PAP;    Body fluid cell count with differential    Body fluid cell count with differential    Amylase, pleural or peritoneal fluid    Amylase, pleural or peritoneal fluid    Albumin, pleural or peritoneal fluid    Albumin, pleural or peritoneal fluid    Glucose, pleural or peritoneal fluid    Glucose, pleural or peritoneal fluid    CANCELED: Gram stain    CANCELED: Gram stain    CANCELED: Body fluid culture    CANCELED: Body fluid culture  3. Pleural effusion  J90 Cytology - Non PAP;    Cytology - Non PAP;    Body fluid cell count with differential    Body fluid cell count with differential    Amylase, pleural or peritoneal fluid    Amylase, pleural or peritoneal fluid    Albumin,  pleural or peritoneal fluid    Albumin, pleural or peritoneal fluid    Glucose, pleural or peritoneal fluid    Glucose, pleural or peritoneal fluid    CANCELED: Gram stain    CANCELED: Gram stain    CANCELED: Body fluid culture    CANCELED: Body fluid culture  4. Pleural effusion on right  J90 US THORACENTESIS ASP PLEURAL SPACE W/IMG GUIDE    US THORACENTESIS ASP PLEURAL SPACE W/IMG GUIDE    Cytology - Non PAP;    Cytology - Non PAP;    Body fluid cell count with differential    Body fluid cell count with differential    Amylase, pleural or peritoneal fluid    Amylase, pleural or peritoneal fluid    Albumin, pleural or peritoneal fluid    Albumin, pleural or peritoneal fluid    Glucose, pleural or peritoneal fluid    Glucose, pleural or peritoneal fluid    DG CHEST PORT 1 VIEW    DG CHEST PORT 1 VIEW    CANCELED: IR THORACENTESIS ASP PLEURAL SPACE W/IMG GUIDE    CANCELED: IR THORACENTESIS ASP PLEURAL SPACE W/IMG GUIDE    CANCELED: Gram stain    CANCELED: Gram stain    CANCELED: Body fluid culture    CANCELED: Body fluid culture  5. S/P thoracentesis  Z98.890 DG Chest 1 View    DG Chest 1 View    Cytology - Non PAP;    Cytology - Non PAP;    Body fluid cell count with differential    Body fluid cell count with differential    Amylase, pleural or peritoneal fluid    Amylase, pleural or peritoneal fluid    Albumin, pleural or peritoneal fluid    Albumin, pleural or peritoneal fluid    Glucose, pleural or peritoneal fluid    Glucose, pleural or peritoneal fluid    CANCELED: Gram stain    CANCELED: Gram stain    CANCELED: Body fluid culture    CANCELED: Body fluid culture  6. Acute respiratory failure with hypoxia ([facility])  J96.01   7. Status post bronchoscopy with biopsy  Z98.890 DG CHEST PORT 1 VIEW    DG CHEST PORT 1 VIEW  8. Dyspnea  R06.00 US THORACENTESIS ASP PLEURAL SPACE W/IMG GUIDE    US THORACENTESIS ASP PLEURAL SPACE W/IMG GUIDE    CANCELED: IR  THORACENTESIS ASP PLEURAL SPACE W/IMG GUIDE    CANCELED: IR THORACENTESIS ASP PLEURAL SPACE W/IMG GUIDE  9. Malignant pleural effusion  J91.0 US THORACENTESIS ASP PLEURAL SPACE W/IMG GUIDE    US THORACENTESIS ASP PLEURAL SPACE W/IMG GUIDE    CANCELED: IR THORACENTESIS ASP PLEURAL SPACE W/IMG GUIDE    CANCELED: IR THORACENTESIS ASP PLEURAL SPACE W/IMG GUIDE  10. Non-small cell lung cancer (NSCLC) (HCC)  C34.90 US THORACENTESIS ASP PLEURAL SPACE W/IMG GUIDE    US THORACENTESIS ASP PLEURAL SPACE W/IMG GUIDE    CANCELED: IR THORACENTESIS ASP PLEURAL SPACE W/IMG GUIDE    CANCELED: IR THORACENTESIS ASP PLEURAL SPACE W/IMG GUIDE    HISTORY OF PRESENT ILLNESS: Massai Hankerson is a 74 y.o. male seen at the request of Dr. Vaughan Browner for a newly diagnosed NSCLC of the right hilar lung. The patient presented with symptoms of increasing shortness of breath that had been going on for several months but recently worsened. He presented on 01/24/20 to the ED and his work up included a CT PA that revealed right infrahilar to hilar mass with metastatic adenopathy in the right hilum and paratracheal region, small bilateral adrenal masses could either be adenomatous or metastatic appearing, and a lytic destruction of the left fourth rib was felt to be concerning for metastasis.  There is a moderate-sized right effusion with dependent atelectasis, and stigmata of atherosclerotic disease of the coronary and aortic vessels.  He underwent thoracentesis on 01/25/2020.  Cytology did not reveal any malignant cells rather reactive, he underwent bronchoscopy yesterday and final results are pending but preliminary with the pathologist in the room at the time of the procedure were consistent on the biopsy of the right lung along the hilum for being a non-small cell lung cancer.  Because of the postobstructive nature of his disease in the chest, we have been asked to evaluate the patient to consider palliative radiotherapy.  Yesterday after  discussing his case with the hospitalist Dr. Lonny Prude, the pulmonologist, Dr. Vaughan Browner both taking care of this patient, I suggested that we consider therapeutic thoracentesis.  His prior thoracentesis on 01/25/2020 withdrew 1.2 L however his post procedural images still show similar appearance of the effusion.  Yesterday the patient was not quite ready to discuss the findings and treatment options, he is contacted again today to move forward with our conversation.  PREVIOUS RADIATION THERAPY: No  PAST MEDICAL HISTORY:  Past Medical History:  Diagnosis Date  . Arthritis   .  Hypertension   . Hypothyroidism   . Subcutaneous cysts, generalized    right thigh and lt buttock      PAST SURGICAL HISTORY: Past Surgical History:  Procedure Laterality Date  . CYST EXCISION N/A 05/02/2015   Procedure: EXCISION OF CYST RIGHT THIGH AND BUTTOCK;  Surgeon: Armandina Gemma, MD;  Location: [facility];  Service: General;  Laterality: N/A;  . TONSILLECTOMY      FAMILY HISTORY: History reviewed. No pertinent family history.  SOCIAL HISTORY:  Social History   Socioeconomic History  . Marital status: Widowed    Spouse name: Not on file  . Number of children: Not on file  . Years of education: Not on file  . Highest education level: Not on file  Occupational History  . Not on file  Tobacco Use  . Smoking status: Current Every Day Smoker    Packs/day: 1.00  . Smokeless tobacco: Never Used  Vaping Use  . Vaping Use: Never used  Substance and Sexual Activity  . Alcohol use: Yes    Comment: social  . Drug use: No  . Sexual activity: Not on file  Other Topics Concern  . Not on file  Social History Narrative  . Not on file   Social Determinants of Health   Financial Resource Strain:   . Difficulty of Paying Living Expenses:   Food Insecurity:   . Worried About Charity fundraiser in the Last Year:   . Arboriculturist in the Last Year:   Transportation Needs:   . Lexicographer (Medical):   Marland Kitchen Lack of Transportation (Non-Medical):   Physical Activity:   . Days of Exercise per Week:   . Minutes of Exercise per Session:   Stress:   . Feeling of Stress :   Social Connections:   . Frequency of Communication with Friends and Family:   . Frequency of Social Gatherings with Friends and Family:   . Attends Religious Services:   . Active Member of Clubs or Organizations:   . Attends Archivist Meetings:   Marland Kitchen Marital Status:   Intimate Partner Violence:   . Fear of Current or Ex-Partner:   . Emotionally Abused:   Marland Kitchen Physically Abused:   . Sexually Abused:     ALLERGIES: Patient has no known allergies.  MEDICATIONS:  Current Facility-Administered Medications  Medication Dose Route Frequency Provider Last Rate Last Admin  . acetaminophen (TYLENOL) tablet 650 mg  650 mg Oral Q6H PRN Reubin Milan, MD       Or  . acetaminophen (TYLENOL) suppository 650 mg  650 mg Rectal Q6H PRN Reubin Milan, MD      . albuterol (VENTOLIN HFA) 108 (90 Base) MCG/ACT inhaler 1-2 puff  1-2 puff Inhalation Q6H PRN Oretha Milch D, MD   6 puff at 01/29/20 1436  . amLODipine (NORVASC) tablet 10 mg  10 mg Oral Daily Oretha Milch D, MD   10 mg at 01/29/20 1606  . budesonide (PULMICORT) nebulizer solution 0.5 mg  0.5 mg Nebulization BID Reubin Milan, MD   0.5 mg at 01/30/20 1448  . chlorpheniramine-HYDROcodone (TUSSIONEX) 10-8 MG/5ML suspension 5 mL  5 mL Oral Q12H PRN Reubin Milan, MD   5 mL at 01/28/20 1050  . feeding supplement (BOOST / RESOURCE BREEZE) liquid 1 Container  1 Container Oral TID BM Desiree Hane, MD   1 Container at 01/29/20 2101  . guaiFENesin (MUCINEX) 12 hr tablet 600  mg  600 mg Oral BID Reubin Milan, MD   600 mg at 01/29/20 2126  . ipratropium-albuterol (DUONEB) 0.5-2.5 (3) MG/3ML nebulizer solution 3 mL  3 mL Nebulization TID Reubin Milan, MD   3 mL at 01/30/20 7322  . ipratropium-albuterol (DUONEB)  0.5-2.5 (3) MG/3ML nebulizer solution 3 mL  3 mL Nebulization Q6H PRN Oretha Milch D, MD   3 mL at 01/27/20 0447  . levothyroxine (SYNTHROID) tablet 100 mcg  100 mcg Oral Q M,W,F,Sa-1800 Oretha Milch D, MD   100 mcg at 01/29/20 1606  . melatonin tablet 3 mg  3 mg Oral QHS PRN Oretha Milch D, MD   3 mg at 01/30/20 0126  . ondansetron (ZOFRAN) tablet 4 mg  4 mg Oral Q6H PRN Reubin Milan, MD       Or  . ondansetron [facility]) injection 4 mg  4 mg Intravenous Q6H PRN Reubin Milan, MD        REVIEW OF SYSTEMS:  On review of systems, the patient reports that he is doing better since his thoracentesis. He reports he has had about 20 pounds of weight loss since December 2020, prior to that he lost 10 pounds, gained it back, but since has dropped 20 pounds. He reports he has progressively had shortness of breath the last two months. He denies any chest wall pain. He denies  cough, fevers, chills, night sweats. No other complaints are noted.    PHYSICAL EXAM:  Wt Readings from Last 3 Encounters:  01/29/20 (!) 245 lb (111.1 kg)  05/02/15 225 lb (102.1 kg)   Temp Readings from Last 3 Encounters:  01/30/20 (!) 97.5 F (36.4 C) (Oral)  05/02/15 97.5 F (36.4 C)   BP Readings from Last 3 Encounters:  01/30/20 128/80  05/02/15 (!) 159/83   Pulse Readings from Last 3 Encounters:  01/30/20 75  05/02/15 (!) 57   Pain Assessment Pain Score: 0-No pain/10  In general this is a well appearing caucasian male in no acute distress. He's alert and oriented x4 and appropriate throughout the examination. Cardiopulmonary assessment is negative for acute distress and he exhibits normal effort.    KPS = 70  100 - Normal; no complaints; no evidence of disease. 90   - Able to carry on normal activity; minor signs or symptoms of disease. 80   - Normal activity with effort; some signs or symptoms of disease. 89   - Cares for self; unable to carry on normal activity or to do active work. 60    - Requires occasional assistance, but is able to care for most of his personal needs. 50   - Requires considerable assistance and frequent medical care. 87   - Disabled; requires special care and assistance. 10   - Severely disabled; hospital admission is indicated although death not imminent. 22   - Very sick; hospital admission necessary; active supportive treatment necessary. 10   - Moribund; fatal processes progressing rapidly. 0     - Dead  Karnofsky DA, Abelmann [place], Craver LS and [facility]chenal [facility] [phone]) The use of the nitrogen mustards in the palliative treatment of carcinoma: with particular reference to bronchogenic carcinoma Cancer 1 [id number]  LABORATORY DATA:  Lab Results  Component Value Date   WBC 13.5 (H) 01/30/2020   HGB 10.8 (L) 01/30/2020   HCT 35.1 (L) 01/30/2020   MCV 98.0 01/30/2020   PLT 444 (H) 01/30/2020   Lab Results  Component Value Date   NA 135  01/30/2020   K 4.4 01/30/2020   CL 95 (L) 01/30/2020   CO2 29 01/30/2020   Lab Results  Component Value Date   ALT 20 01/24/2020   AST 22 01/24/2020   ALKPHOS 85 01/24/2020   BILITOT 1.1 01/24/2020     RADIOGRAPHY: DG Chest 1 View  Result Date: 01/25/2020 CLINICAL DATA:  Status post right-sided thoracentesis. EXAM: CHEST  1 VIEW COMPARISON:  Ultrasound 01/25/2020. Chest x-ray 01/24/2020. Chest CT 01/24/2020. FINDINGS: Mediastinum is stable. Persistent right lower lobe atelectasis/consolidation. Reference is made to recent CT report for discussion of underlying mass. Small right pleural effusion remains after pneumothorax. No pneumothorax. Persistent left base atelectasis. Reference is made to CT report for discussion of left rib lesion. IMPRESSION: 1. No evidence of pneumothorax post thoracentesis. Small residual right pleural effusion. No pneumothorax 2. Persistent right lower lobe atelectasis/consolidation. Reference is made to recent CT report for discussion of underlying mass. Reference is made to CT report for  discussion of left rib lesion. 2.  Persistent left base atelectasis. Electronically Signed   By: Marcello Moores  Register   On: 01/25/2020 13:33   DG Chest 2 View  Result Date: 01/24/2020 CLINICAL DATA:  Short of breath. EXAM: CHEST - 2 VIEW COMPARISON:  None FINDINGS: Mild cardiac enlargement. Small left pleural effusion. Significantly diminished aeration to the right lower lobe is favored to represent a combination of pleural effusion, atelectasis and airspace consolidation. No airspace opacities identified within the left lung. Remote right lower posterior rib deformities. IMPRESSION: Diminished aeration to the right lower lobe likely due to pleural effusion, atelectasis and airspace consolidation. Underlying mass lesion would be difficult to exclude. Consider further evaluation with CT of the chest with contrast material. Electronically Signed   By: Kerby Moors M.D.   On: 01/24/2020 11:59   CT HEAD WO CONTRAST  Result Date: 01/27/2020 CLINICAL DATA:  Delirium.  Lung mass. EXAM: CT HEAD WITHOUT CONTRAST TECHNIQUE: Contiguous axial images were obtained from the base of the skull through the vertex without intravenous contrast. COMPARISON:  03/16/2011 FINDINGS: Brain: No acute cortically based infarct, intracranial hemorrhage, mass, midline shift, or extra-axial fluid collection is identified. Patchy hypodensities in the cerebral white matter bilaterally are new and nonspecific but compatible with moderate chronic small vessel ischemic disease. 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 The ventricles and sulci are within normal limits for age. Vascular: Calcified atherosclerosis at the skull base. No hyperdense vessel. Skull: No fracture or suspicious osseous lesion. Sinuses/Orbits: Visualized paranasal sinuses and mastoid air cells are clear. Unremarkable orbits. Other: None. IMPRESSION: 1. No evidence of acute intracranial abnormality. 2. Moderate chronic small vessel ischemic disease, new  from 2012. Electronically Signed   By: Logan Bores M.D.   On: 01/27/2020 11:39   CT Angio Chest PE W and/or Wo Contrast  Result Date: 01/24/2020 CLINICAL DATA:  Chest pain and shortness of breath over the last 2 months. EXAM: CT ANGIOGRAPHY CHEST WITH CONTRAST TECHNIQUE: Multidetector CT imaging of the chest was performed using the standard protocol during bolus administration of intravenous contrast. Multiplanar CT image reconstructions and MIPs were obtained to evaluate the vascular anatomy. CONTRAST:  158mL OMNIPAQUE IOHEXOL 350 MG/ML SOLN COMPARISON:  Chest radiography same day FINDINGS: Cardiovascular: Pulmonary arterial opacification is good. No visible pulmonary emboli. There is aortic atherosclerosis without aneurysm or dissection. Heart size is normal. Coronary artery calcification is noted. Mediastinum/Nodes: Suspicion of a right infrahilar to hilar mass with right hilar adenopathy and right paratracheal adenopathy.  Lungs/Pleura: As above, there is suspicion of a right hilar to infrahilar mass, possibly on the order of 4-5 cm in size, with right hilar and paratracheal adenopathy. There is a moderate pleural effusion on the right layering dependently. Right lower lobe is collapsed. There is airspace filling and interstitial prominence in the right middle lobe and dependent right upper lobe. No focal finding on the left. Upper Abdomen: Mild enlargement of the right adrenal gland with an area measuring approximately 10 x 19 mm that could be a metastasis or adenoma. Similar prominence of the inferior limb of the left adrenal gland. Areas of low-density in the liver are indeterminate and could be cysts, hemangiomas or metastatic disease. Musculoskeletal: Lytic destructive lesion of the left lateral fourth rib worrisome for bony metastatic disease. I do not see a spinal or sternal lesion. Review of the MIP images confirms the above findings. IMPRESSION: Right infrahilar to hilar mass with metastatic  adenopathy in the right hilum and paratracheal region. Small bilateral adrenal masses that could be adenomas or metastases. Lytic destructive lesion of the left lateral fourth rib highly likely to be a metastatic lesion. Moderate size right effusion with dependent atelectasis. Density in the adjacent right middle lobe and right upper lobe could be atelectasis, pneumonia or interstitial spread of carcinoma. Aortic Atherosclerosis (ICD10-I70.0). Coronary artery calcification also present. Electronically Signed   By: Nelson Chimes M.D.   On: 01/24/2020 17:15   DG CHEST PORT 1 VIEW  Result Date: 01/29/2020 CLINICAL DATA:  74 year old male status post bronchoscopy today for right lower lobe mass. Bronchoscopic biopsy. EXAM: PORTABLE CHEST 1 VIEW COMPARISON:  Portable chest 01/27/2020, CT 01/24/2020. FINDINGS: Portable AP semi upright view at 1502 hours. Stable lung volumes and mediastinal contours. No pneumothorax. Continued dense right mid and lower lung opacification. Allowing for portable technique the left lung remains clear. Stable visualized osseous structures. IMPRESSION: No adverse features status post bronchoscopy. Stable opacification of the right lower lung. Electronically Signed   By: Genevie Ann M.D.   On: 01/29/2020 15:11   DG CHEST PORT 1 VIEW  Result Date: 01/27/2020 CLINICAL DATA:  74 year old male status post thoracentesis on the right side. EXAM: PORTABLE CHEST 1 VIEW COMPARISON:  Chest x-ray 01/25/2020. FINDINGS: Persistent moderate to large right pleural effusion with atelectasis and/or consolidation in the right lung base. Left lung is clear. No left pleural effusion. No pneumothorax. No evidence of pulmonary edema. Heart size appears borderline enlarged. Upper mediastinal contours are distorted by patient's rotation to the right. IMPRESSION: 1. Persistent moderate to large right pleural effusion with atelectasis and/or consolidation in the right lung base. 2. No pneumothorax. Electronically  Signed   By: Vinnie Langton M.D.   On: 01/27/2020 10:50   US THORACENTESIS ASP PLEURAL SPACE W/IMG GUIDE  Result Date: 01/25/2020 INDICATION: Patient with history of right infrahilar hilar mass seen on CT a chest 01/24/2020, cough, dyspnea, and right pleural effusion. Request made for diagnostic and therapeutic right thoracentesis. EXAM: ULTRASOUND GUIDED DIAGNOSTIC AND THERAPEUTIC RIGHT THORACENTESIS MEDICATIONS: 15 mL 1% lidocaine COMPLICATIONS: None immediate. PROCEDURE: An ultrasound guided thoracentesis was thoroughly discussed with the patient and questions answered. The benefits, risks, alternatives and complications were also discussed. The patient understands and wishes to proceed with the procedure. Written consent was obtained. Ultrasound was performed to localize and mark an adequate pocket of fluid in the right chest. The area was then prepped and draped in the normal sterile fashion. 1% Lidocaine was used for local anesthesia. Under ultrasound guidance a  19 gauge, 7-cm, Yueh catheter was introduced by Dr. Pascal Lux. Thoracentesis was performed. The catheter was removed and a dressing applied. FINDINGS: A total of approximately 1.2 L of hazy amber fluid was removed. Samples were sent to the laboratory as requested by the clinical team. IMPRESSION: Successful ultrasound guided right thoracentesis yielding 1.2 L of pleural fluid. Read by: Earley Abide, PA-C Electronically Signed   By: Sandi Mariscal M.D.   On: 01/25/2020 13:36      IMPRESSION/PLAN: 1. 74 y.o. gentleman with at least Stage III  NSCLC, of the right hilum with postobstructive changes and right pleural effusion. Dr. Tammi Klippel has reviewed his case and I discussed the pathology findings and nature of advanced lung disease. He will need an MRI which will be ordered today while inpaient, and an outpatient PET scan to confirm his staging, but he may have stage IV disease with the description of his disease in the chest including his rib  lesion, and possibly a metastasis on the right adrenal gland.  I discussed the rationale to proceed with palliative radiotherapy to the chest with goals to improve his aeration of the lung and try to reduce the risks of hemoptysis. He does have a large effusion, and I discussed with his other providers, it may be more therapeutic for the patient and help with our treatment planning for his care to proceed with therapeutic thoracentesis prior to proceeding with treatment or treatment planning. He is in agreement with this. We discussed the risks, benefits, short, and long term effects of radiotherapy, and the patient is interested in proceeding. Dr. Tammi Klippel recommends a course of 10 fractions over 2 weeks of radiotherapy to the right chest. If PET shows limited disease, or if he does have oligometastatic disease he may be a candidate to continue his therapy into more of a chemoRT scheme. He will proceed with simulation tomorrow at 11 am. Written consent is obtained and placed in the chart, a copy will be provided to the patient at the time of simulation.    In a visit lasting 90 minutes, greater than 50% of the time was spent face to face discussing the patient's condition, in preparation for the discussion, and coordinating the patient's care.    Carola Rhine, [facility]   Page Me    On Behalf Of _____________________________________  Sheral Apley Tammi Klippel, M.D.

## 2020-01-31 ENCOUNTER — Ambulatory Visit
Admit: 2020-01-31 | Discharge: 2020-01-31 | Disposition: A | Discharge status: Home / Self Care | Payer: Medicare HMO | Attending: Radiation Oncology | Admitting: Radiation Oncology

## 2020-01-31 ENCOUNTER — Telehealth: Payer: Self-pay | Admitting: *Deleted

## 2020-01-31 DIAGNOSIS — C3431 Malignant neoplasm of lower lobe, right bronchus or lung: Secondary | ICD-10-CM

## 2020-01-31 DIAGNOSIS — J9601 Acute respiratory failure with hypoxia: Secondary | ICD-10-CM | POA: Diagnosis not present

## 2020-01-31 DIAGNOSIS — I10 Essential (primary) hypertension: Secondary | ICD-10-CM | POA: Diagnosis not present

## 2020-01-31 MED ORDER — AMLODIPINE BESYLATE 10 MG PO TABS: 10.0000 mg | ORAL_TABLET | Freq: Every day | ORAL | 1 refills | Status: AC

## 2020-01-31 MED ORDER — BUDESONIDE 0.5 MG/2ML IN SUSP: 0.5000 mg | Freq: Two times a day (BID) | RESPIRATORY_TRACT | 12 refills | Status: AC

## 2020-01-31 MED ORDER — HYDROCOD POLST-CPM POLST ER 10-8 MG/5ML PO SUER: 5.0000 mL | Freq: Two times a day (BID) | ORAL | 0 refills | Status: DC | PRN

## 2020-01-31 NOTE — Discharge Summary (Signed)
Physician Discharge Summary  Jack Thompson NAT:[id number] DOB: 11/27/1945 DOA: 01/24/2020  PCP: Vernie Shanks, MD  Admit date: 01/24/2020 Discharge date: 01/31/2020  Admitted From: Home.  Disposition:  Home.   Recommendations for Outpatient Follow-up:  1. Follow up with PCP in 1-2 weeks 2. Please obtain BMP/CBC in one week 3. Please follow up with radiation oncology as scheduled.   Home Health:yes   Discharge Condition: guarded.  CODE STATUS: full code.  Diet recommendation: Heart Healthy   Brief/Interim Summary:  Jack Thompson is a 74 y.o. year old male with medical history significant for tobacco abuse (45-year smoker, quit 25 years ago), HTN, OA who presented on 01/24/2020 with worsening productive cough and dyspnea on going for the past 2 months with minimal improvement.  Patient states he was seen by his primary.  2 months ago prescribed Combivent inhaler in addition to his usual chest x-ray is coughing episodes.  He has required Combivent 4 times daily.  His cough is occasionally productive of clear sputum but typically despite multiple coughing episodes the phlegm stays stuck in his throat.  He denies any fevers, chills, no hemoptysis, no recent antibiotic use.  He reports 20 pound weight loss over the past 2 months with minimal appetite.  Day prior to admission patient states worsening dyspnea, coughing episode and very minimal improvement with inhalers.  In the ED, he was afebrile with a T-max, [29 SPO2 90% put on 2 L, blood pressure 160/88.  Covid test negative (vaccination per record March 2001).  WBC 14.7.  Hemoglobin 12.5, sodium 133, BUN 20. Showed persistent airspace consolidation.  CTA chest negative for PE, moderate right effusion infrahilar mass with metastatic peritracheal lesion, small bilateral adrenal masses represent adenoma/infectious.  Hospital course: Underwent diagnostic/therapeutic thoracentesis with removal of 1.2 L of fluid from right side of lungs by IR on  7/22.  Pleural fluid cytology was unremarkable.  Underwent bronchoscopy by PCCM on 7/26 with right lower lobe and left lower lobe mass found, bronchial washings were sent for cytology, gross view concerning for non-small cell lung cancer.  Discharge Diagnoses:  Principal Problem:   Acute respiratory failure with hypoxia (HCC) Active Problems:   Hypertension   Hypothyroidism   Hypoalbuminemia   Normocytic anemia   Osteoarthritis   Hyponatremia   Pleural effusion on right   Right hilar/infrahilar mass   Lytic lesion of bone on x-ray   Leukocytosis   Cough in adult  Acute hypoxic respiratory failure of multifactorial etiology including right size moderate pleural effusion with dependent atelectasis and infrahilar mass, stable .  Stable on 3L, increased O2 requirements particularly during coughing episodes. Repeat CXR already shows re-accumulation of R pleural effusion.  Compressive atelectasis also present Underwent  IR guided thoracentesis for therapeutic removal on 7/27 -Mucinex scheduled, tussinex as needed.  - weaned him to 2lit of [facility] oxygen and discharged.   NSCLC, newly diagnosed. Right infrahilar mass with suspected metastatic lesions to adrenal gland, ribs, peritracheal adenopathy.   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  -I discussed case with medical oncology, referral has been made to Dr. Lorenso Courier for follow-up, they will be contacting patient for appointment  Confusion, intermittent.  Resolved after discontinuing PRN ativan at night.  Currently alert and oriented x4.  CT head nonacute   Leukocytosis, mild, continues to steadily improve Remains afebrile.  Suspect this  likely stress-induced however patient is at increased risk for postobstructive related to mass   Hyponatremia, mild,  resolved.  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  No signs or symptoms of blood loss   Hypothyroidism -TSH within normal limits -Continue Synthroid  Hypertension BETTER CONTROLLED.  -Amlodipine  10 mg     Discharge Instructions  Discharge Instructions    Diet - low sodium heart healthy   Complete by: As directed    Discharge instructions   Complete by: As directed    Please follow up with PCP in one week.     Allergies as of 01/31/2020   No Known Allergies     Medication List    STOP taking these medications   HYDROcodone-acetaminophen 5-325 MG tablet Commonly known as: NORCO/VICODIN   meloxicam 15 MG tablet Commonly known as: MOBIC   naproxen sodium 220 MG tablet Commonly known as: ALEVE     TAKE these medications   amLODipine 10 MG tablet Commonly known as: NORVASC Take 1 tablet (10 mg total) by mouth daily. Start taking on: February 01, 2020 What changed:   medication strength  how much to take   budesonide 0.5 MG/2ML nebulizer solution Commonly known as: PULMICORT Take 2 mLs (0.5 mg total) by nebulization 2 (two) times daily.   chlorpheniramine-HYDROcodone 10-8 MG/5ML Suer Commonly known as: TUSSIONEX Take 5 mLs by mouth every 12 (twelve) hours as needed for cough.   Combivent Respimat 20-100 MCG/ACT Aers respimat Generic drug: Ipratropium-Albuterol Inhale 2 puffs into the lungs every 6 (six) hours as needed for wheezing or shortness of breath.   guaiFENesin 600 MG 12 hr tablet Commonly known as: MUCINEX Take 600 mg by mouth 2 (two) times daily as needed for cough.   levothyroxine 100 MCG tablet Commonly known as: SYNTHROID Take  100 mcg by mouth as directed. Take on MWF & Sat            Durable Medical Equipment  (From admission, onward)         Start     Ordered   01/31/20 1357  For home use only DME Nebulizer machine  Once       Question Answer Comment  Patient needs a nebulizer to treat with the following condition Lung cancer ([place])   Length of Need Lifetime      01/31/20 1356   01/31/20 1112  For home use only DME oxygen  Once       Question Answer Comment  Length of Need Lifetime   Mode or (Route) Nasal cannula   Liters per Minute 2   Frequency Continuous (stationary and portable oxygen unit needed)   Oxygen conserving device Yes   Oxygen delivery system Gas      01/31/20 1112          Follow-up Information    [facility] Follow up.   Why: oxygen, neb machine Contact information: [place] [address] [phone]        Vernie Shanks, MD. Schedule an appointment as soon as possible for a visit in 1 week(s).   Specialty: Family Medicine Contact information: [place] [address] [phone]  No Known Allergies  Consultations:  Radiation oncology.    Procedures/Studies: DG Chest 1 View  Result Date: 01/25/2020 CLINICAL DATA:  Status post right-sided thoracentesis. EXAM: CHEST  1 VIEW COMPARISON:  Ultrasound 01/25/2020. Chest x-ray 01/24/2020. Chest CT 01/24/2020. FINDINGS: Mediastinum is stable. Persistent right lower lobe atelectasis/consolidation. Reference is made to recent CT report for discussion of underlying mass. Small right pleural effusion remains after pneumothorax. No pneumothorax. Persistent left base atelectasis. Reference is made to CT report for discussion of left rib lesion. IMPRESSION: 1. No evidence of pneumothorax post thoracentesis. Small residual right pleural effusion. No pneumothorax 2. Persistent right lower lobe atelectasis/consolidation. Reference is made to recent CT report for  discussion of underlying mass. Reference is made to CT report for discussion of left rib lesion. 2.  Persistent left base atelectasis. Electronically Signed   By: Marcello Moores  Register   On: 01/25/2020 13:33   DG Chest 2 View  Result Date: 01/24/2020 CLINICAL DATA:  Short of breath. EXAM: CHEST - 2 VIEW COMPARISON:  None FINDINGS: Mild cardiac enlargement. Small left pleural effusion. Significantly diminished aeration to the right lower lobe is favored to represent a combination of pleural effusion, atelectasis and airspace consolidation. No airspace opacities identified within the left lung. Remote right lower posterior rib deformities. IMPRESSION: Diminished aeration to the right lower lobe likely due to pleural effusion, atelectasis and airspace consolidation. Underlying mass lesion would be difficult to exclude. Consider further evaluation with CT of the chest with contrast material. Electronically Signed   By: Kerby Moors M.D.   On: 01/24/2020 11:59   CT HEAD WO CONTRAST  Result Date: 01/27/2020 CLINICAL DATA:  Delirium.  Lung mass. EXAM: CT HEAD WITHOUT CONTRAST TECHNIQUE: Contiguous axial images were obtained from the base of the skull through the vertex without intravenous contrast. COMPARISON:  03/16/2011 FINDINGS: Brain: No acute cortically based infarct, intracranial hemorrhage, mass, midline shift, or extra-axial fluid collection is identified. Patchy hypodensities in the cerebral white matter bilaterally are new and nonspecific but compatible with moderate chronic small vessel ischemic disease. 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 The ventricles and sulci are within normal limits for age. Vascular: Calcified atherosclerosis at the skull base. No hyperdense vessel. Skull: No fracture or suspicious osseous lesion. Sinuses/Orbits: Visualized paranasal sinuses and mastoid air cells are clear. Unremarkable orbits. Other: None. IMPRESSION: 1. No evidence of acute intracranial  abnormality. 2. Moderate chronic small vessel ischemic disease, new from 2012. Electronically Signed   By: Logan Bores M.D.   On: 01/27/2020 11:39   CT Angio Chest PE W and/or Wo Contrast  Result Date: 01/24/2020 CLINICAL DATA:  Chest pain and shortness of breath over the last 2 months. EXAM: CT ANGIOGRAPHY CHEST WITH CONTRAST TECHNIQUE: Multidetector CT imaging of the chest was performed using the standard protocol during bolus administration of intravenous contrast. Multiplanar CT image reconstructions and MIPs were obtained to evaluate the vascular anatomy. CONTRAST:  137mL OMNIPAQUE IOHEXOL 350 MG/ML SOLN COMPARISON:  Chest radiography same day FINDINGS: Cardiovascular: Pulmonary arterial opacification is good. No visible pulmonary emboli. There is aortic atherosclerosis without aneurysm or dissection. Heart size is normal. Coronary artery calcification is noted. Mediastinum/Nodes: Suspicion of a right infrahilar to hilar mass with right hilar adenopathy and right paratracheal adenopathy. Lungs/Pleura: As above, there is suspicion of a right hilar to infrahilar mass, possibly on the order of 4-5 cm in size, with right hilar and paratracheal adenopathy. There is a moderate pleural effusion on the right layering  dependently. Right lower lobe is collapsed. There is airspace filling and interstitial prominence in the right middle lobe and dependent right upper lobe. No focal finding on the left. Upper Abdomen: Mild enlargement of the right adrenal gland with an area measuring approximately 10 x 19 mm that could be a metastasis or adenoma. Similar prominence of the inferior limb of the left adrenal gland. Areas of low-density in the liver are indeterminate and could be cysts, hemangiomas or metastatic disease. Musculoskeletal: Lytic destructive lesion of the left lateral fourth rib worrisome for bony metastatic disease. I do not see a spinal or sternal lesion. Review of the MIP images confirms the above  findings. 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 Small bilateral adrenal masses that could be adenomas or metastases. Lytic destructive lesion of the left lateral fourth rib highly likely to be a metastatic lesion. Moderate size right effusion with dependent atelectasis. Density in the adjacent right middle lobe and right upper lobe could be atelectasis, pneumonia or interstitial spread of carcinoma. Aortic Atherosclerosis (ICD10-I70.0). Coronary artery calcification also present. Electronically Signed   By: Nelson Chimes M.D.   On: 01/24/2020 17:15   MR BRAIN W WO CONTRAST  Result Date: 01/30/2020 CLINICAL DATA:  74 year old male with recently diagnosed right hilum/lung mass. Staging of non-small cell lung cancer. EXAM: MRI HEAD WITHOUT AND WITH CONTRAST TECHNIQUE: Multiplanar, multiecho pulse sequences of the brain and surrounding structures were obtained without and with intravenous contrast. CONTRAST:  74mL GADAVIST GADOBUTROL 1 MMOL/ML IV SOLN COMPARISON:  Head CT without contrast 01/27/2020. FINDINGS: Brain: No abnormal enhancement identified. No midline shift, mass effect, or evidence of intracranial mass lesion. No dural thickening. No restricted diffusion to suggest acute infarction. No ventriculomegaly, extra-axial collection or acute intracranial hemorrhage. Cervicomedullary junction and pituitary are within normal limits. Patchy and confluent bilateral cerebral white matter T2 and FLAIR hyperintensity with superimposed chronic lacunar infarcts of the bilateral corona radiata and basal ganglia. Occasional chronic micro hemorrhages (right parietal lobe series 9, image 24). No cortical encephalomalacia identified. Vascular: Major intracranial vascular flow voids are preserved. Skull and upper cervical spine: Diffusely decreased T1 marrow signal throughout the skull and visible cervical spine. No destructive osseous lesion is  identified. Negative visible spinal cord. Sinuses/Orbits: Negative orbits. Paranasal sinuses and mastoids are stable and well pneumatized. Other: Visible internal auditory structures appear normal. Scalp and face soft tissues appear negative. IMPRESSION: 1. No metastatic disease to the brain or acute intracranial abnormality. Moderate chronic small vessel disease in the brain. 2. Diffusely decreased bone marrow signal with no destructive osseous lesion identified. Perhaps this is red marrow reactivation such as from anemia, but osseous metastatic disease is difficult to exclude. Electronically Signed   By: Genevie Ann M.D.   On: 01/30/2020 18:38   DG Chest Port 1 View  Result Date: 01/30/2020 CLINICAL DATA:  Right-sided thoracentesis. EXAM: PORTABLE CHEST 1 VIEW COMPARISON:  01/29/2020.  CT 01/24/2020. FINDINGS: Mediastinum stable. Stable cardiomegaly. Atelectasis/infiltrate right lung base. Moderate right pleural effusion. Similar findings noted on prior exam. No evidence of pneumothorax post thoracentesis. Degenerative change thoracic spine. Left fourth rib expansile lesion again noted. IMPRESSION: 1. No evidence of pneumothorax post thoracentesis. Residual right pleural effusion noted. 2.  Right base atelectasis/infiltrate again noted. 3.  Stable cardiomegaly. 4. Left fourth rib expansile lesion as previously noted on prior CT of 01/24/2020. Electronically Signed   By: Marcello Moores  Register   On: 01/30/2020 12:02   DG  CHEST PORT 1 VIEW  Result Date: 01/29/2020 CLINICAL DATA:  74 year old male status post bronchoscopy today for right lower lobe mass. Bronchoscopic biopsy. EXAM: PORTABLE CHEST 1 VIEW COMPARISON:  Portable chest 01/27/2020, CT 01/24/2020. FINDINGS: Portable AP semi upright view at 1502 hours. Stable lung volumes and mediastinal contours. No pneumothorax. Continued dense right mid and lower lung opacification. Allowing for portable technique the left lung remains clear. Stable visualized osseous  structures. IMPRESSION: No adverse features status post bronchoscopy. Stable opacification of the right lower lung. Electronically Signed   By: Genevie Ann M.D.   On: 01/29/2020 15:11   DG CHEST PORT 1 VIEW  Result Date: 01/27/2020 CLINICAL DATA:  74 year old male status post thoracentesis on the right side. EXAM: PORTABLE CHEST 1 VIEW COMPARISON:  Chest x-ray 01/25/2020. FINDINGS: Persistent moderate to large right pleural effusion with atelectasis and/or consolidation in the right lung base. Left lung is clear. No left pleural effusion. No pneumothorax. No evidence of pulmonary edema. Heart size appears borderline enlarged. Upper mediastinal contours are distorted by patient's rotation to the right. IMPRESSION: 1. Persistent moderate to large right pleural effusion with atelectasis and/or consolidation in the right lung base. 2. No pneumothorax. Electronically Signed   By: Vinnie Langton M.D.   On: 01/27/2020 10:50   US THORACENTESIS ASP PLEURAL SPACE W/IMG GUIDE  Result Date: 01/30/2020 INDICATION: Patient with a history of right lung cancer and recurrent right pleural effusions. Interventional radiology asked to perform a therapeutic thoracentesis. EXAM: ULTRASOUND GUIDED THORACENTESIS MEDICATIONS: 1% lidocaine 10 mL COMPLICATIONS: None immediate. PROCEDURE: An ultrasound guided thoracentesis was thoroughly discussed with the patient and questions answered. The benefits, risks, alternatives and complications were also discussed. The patient understands and wishes to proceed with the procedure. Written consent was obtained. Ultrasound was performed to localize and mark an adequate pocket of fluid in the right chest. The area was then prepped and draped in the normal sterile fashion. 1% Lidocaine was used for local anesthesia. Under ultrasound guidance a 6 Fr Safe-T-Centesis catheter was introduced. Thoracentesis was performed. The catheter was removed and a dressing applied. FINDINGS: A total of  approximately 850 mL of straw-colored fluid was removed. IMPRESSION: Successful ultrasound guided right thoracentesis yielding 850 mL of pleural fluid. Read by: Soyla Dryer, NP Electronically Signed   By: Jacqulynn Cadet M.D.   On: 01/30/2020 12:15   US THORACENTESIS ASP PLEURAL SPACE W/IMG GUIDE  Result Date: 01/25/2020 INDICATION: Patient with history of right infrahilar hilar mass seen on CT a chest 01/24/2020, cough, dyspnea, and right pleural effusion. Request made for diagnostic and therapeutic right thoracentesis. EXAM: ULTRASOUND GUIDED DIAGNOSTIC AND THERAPEUTIC RIGHT THORACENTESIS MEDICATIONS: 15 mL 1% lidocaine COMPLICATIONS: None immediate. PROCEDURE: An ultrasound guided thoracentesis was thoroughly discussed with the patient and questions answered. The benefits, risks, alternatives and complications were also discussed. The patient understands and wishes to proceed with the procedure. Written consent was obtained. Ultrasound was performed to localize and mark an adequate pocket of fluid in the right chest. The area was then prepped and draped in the normal sterile fashion. 1% Lidocaine was used for local anesthesia. Under ultrasound guidance a 19 gauge, 7-cm, Yueh catheter was introduced by Dr. Pascal Lux. Thoracentesis was performed. The catheter was removed and a dressing applied. FINDINGS: A total of approximately 1.2 L of hazy amber fluid was removed. Samples were sent to the laboratory as requested by the clinical team. IMPRESSION: Successful ultrasound guided right thoracentesis yielding 1.2 L of pleural fluid. Read by: Earley Abide, PA-C  Electronically Signed   By: Sandi Mariscal M.D.   On: 01/25/2020 13:36       Subjective: No new complaints.   Discharge Exam: Vitals:   01/31/20 0723 01/31/20 1015  BP:  (!) 144/72  Pulse:    Resp:    Temp:    SpO2: 97% 94%   Vitals:   01/30/20 2043 01/31/20 0509 01/31/20 0723 01/31/20 1015  BP: (!) 134/81 112/69  (!) 144/72  Pulse: 89  103    Resp: 20 18    Temp: 97.9 F (36.6 C) 98.5 F (36.9 C)    TempSrc: Oral Oral    SpO2: 94% 91% 97% 94%  Weight:      Height:        General: Pt is alert, awake, not in acute distress Cardiovascular: RRR, S1/S2 +, no rubs, no gallops Respiratory: CTA bilaterally, no wheezing, no rhonchi Abdominal: Soft, NT, ND, bowel sounds + Extremities: no edema, no cyanosis    The results of significant diagnostics from this hospitalization (including imaging, microbiology, ancillary and laboratory) are listed below for reference.     Microbiology: Recent Results (from the past 240 hour(s))  SARS Coronavirus 2 by RT PCR (hospital order, performed in [facility] hospital lab) Nasopharyngeal Nasopharyngeal Swab     Status: None   Collection Time: 01/24/20  8:00 PM   Specimen: Nasopharyngeal Swab  Result Value Ref Range Status   SARS Coronavirus 2 NEGATIVE NEGATIVE Final    Comment: (NOTE) SARS-CoV-2 target nucleic acids are NOT DETECTED.  The SARS-CoV-2 RNA is generally detectable in upper and lower respiratory specimens during the acute phase of infection. The lowest concentration of SARS-CoV-2 viral copies this assay can detect is 250 copies / mL. A negative result does not preclude SARS-CoV-2 infection and should not be used as the sole basis for treatment or other patient management decisions.  A negative result may occur with improper specimen collection / handling, submission of specimen other than nasopharyngeal swab, presence of viral mutation(s) within the areas targeted by this assay, and inadequate number of viral copies (<250 copies / mL). A negative result must be combined with clinical observations, patient history, and epidemiological information.  Fact Sheet for Patients:   StrictlyIdeas.no  Fact Sheet for Healthcare Providers: BankingDealers.co.za  This test is not yet approved or  cleared by the [place] FDA  and has been authorized for detection and/or diagnosis of SARS-CoV-2 by FDA under an Emergency Use Authorization (EUA).  This EUA will remain in effect (meaning this test can be used) for the duration of the COVID-19 declaration under Section 564(b)(1) of the Act, 21 U.S.C. section 360bbb-3(b)(1), unless the authorization is terminated or revoked sooner.  Performed at [facility], [place] [address], [place], [place] [address]   Culture, body fluid-bottle     Status: None   Collection Time: 01/25/20  1:07 PM   Specimen: Pleura  Result Value Ref Range Status   Specimen Description PLEURAL  Final   Special Requests BOTTLES DRAWN AEROBIC AND ANAEROBIC  Final   Culture   Final    NO GROWTH 5 DAYS Performed at [facility], [place] [address] [place],  [address]    Report Status 01/30/2020 FINAL  Final  Gram stain     Status: None   Collection Time: 01/25/20  1:07 PM   Specimen: Pleura  Result Value Ref Range Status   Specimen Description PLEURAL  Final   Special Requests NONE  Final  Gram Stain   Final    RARE WBC PRESENT, PREDOMINANTLY MONONUCLEAR NO ORGANISMS SEEN Performed at [facility] [address] [place], [place] [address]    Report Status 01/26/2020 FINAL  Final  Culture, respiratory     Status: None (Preliminary result)   Collection Time: 01/29/20  1:12 PM   Specimen: Bronchial Washing, Right; Respiratory  Result Value Ref Range Status   Specimen Description   Final    BRONCHIAL ALVEOLAR LAVAGE Performed at [facility] [address] [place], [place] [address]    Special Requests   Final    NONE Performed at [facility], [place] [address], [place], [place] [address]    Gram Stain   Final    RARE WBC PRESENT, PREDOMINANTLY PMN NO ORGANISMS SEEN    Culture   Final    FEW Consistent with normal respiratory flora. Performed at [facility], [place] [address] [place], [place]  [address]    Report Status PENDING  Incomplete     Labs: BNP (last 3 results) Recent Labs    01/24/20 1359  BNP 21.3   Basic Metabolic Panel: Recent Labs  Lab 01/25/20 0514 01/25/20 0514 01/26/20 0510 01/27/20 0556 01/28/20 0635 01/29/20 0556 01/30/20 0501  NA 133*   < > 130* 132* 135 132* 135  K 3.7   < > 3.6 3.6 4.1 3.8 4.4  CL 97*   < > 94* 94* 97* 94* 95*  CO2 26   < > 24 30 30 28 29   GLUCOSE 105*   < > 113* 126* 111* 131* 165*  BUN 14   < > 13 11 8 9 15   CREATININE 0.82   < > 0.72 0.74 0.67 0.72 0.76  CALCIUM 8.6*   < > 8.4* 8.5* 8.4* 8.5* 8.4*  PHOS 4.0  --   --   --   --   --   --    < > = values in this interval not displayed.   Liver Function Tests: Recent Labs  Lab 01/24/20 1359 01/25/20 0514  AST 22  --   ALT 20  --   ALKPHOS 85  --   BILITOT 1.1  --   PROT 7.8  --   ALBUMIN 2.8* 2.6*   No results for input(s): LIPASE, AMYLASE in the last 168 hours. No results for input(s): AMMONIA in the last 168 hours. CBC: Recent Labs  Lab 01/24/20 1359 01/25/20 0514 01/26/20 0510 01/27/20 0556 01/28/20 0635 01/29/20 0556 01/30/20 0501  WBC 14.7*   < > 13.2* 12.6* 11.3* 12.2* 13.5*  NEUTROABS 12.3*  --   --   --   --   --   --   HGB 12.5*   < > 11.1* 11.4* 10.6* 10.8* 10.8*  HCT 38.8*   < > 34.9* 35.9* 33.7* 34.4* 35.1*  MCV 96.5   < > 97.2 97.0 96.6 96.4 98.0  PLT 317   < > 349 397 407* 452* 444*   < > = values in this interval not displayed.   Cardiac Enzymes: No results for input(s): CKTOTAL, CKMB, CKMBINDEX, TROPONINI in the last 168 hours. BNP: Invalid input(s): POCBNP CBG: No results for input(s): GLUCAP in the last 168 hours. D-Dimer No results for input(s): DDIMER in the last 72 hours. Hgb A1c No results for input(s): HGBA1C in the last 72 hours. Lipid Profile No results for input(s): CHOL, HDL, LDLCALC, TRIG, CHOLHDL, LDLDIRECT in the last 72  hours. Thyroid function studies No results for input(s): TSH, T4TOTAL, T3FREE, THYROIDAB in the  last 72 hours.  Invalid input(s): FREET3 Anemia work up No results for input(s): VITAMINB12, FOLATE, FERRITIN, TIBC, IRON, RETICCTPCT in the last 72 hours. Urinalysis    Component Value Date/Time   COLORURINE YELLOW 01/24/2020 1546   APPEARANCEUR CLEAR 01/24/2020 1546   LABSPEC 1.009 01/24/2020 1546   PHURINE 6.0 01/24/2020 1546   GLUCOSEU NEGATIVE 01/24/2020 1546   HGBUR NEGATIVE 01/24/2020 1546   BILIRUBINUR NEGATIVE 01/24/2020 1546   KETONESUR NEGATIVE 01/24/2020 1546   PROTEINUR NEGATIVE 01/24/2020 1546   NITRITE NEGATIVE 01/24/2020 1546   LEUKOCYTESUR NEGATIVE 01/24/2020 1546   Sepsis Labs Invalid input(s): PROCALCITONIN,  WBC,  LACTICIDVEN Microbiology Recent Results (from the past 240 hour(s))  SARS Coronavirus 2 by RT PCR (hospital order, performed in [place] hospital lab) Nasopharyngeal Nasopharyngeal Swab     Status: None   Collection Time: 01/24/20  8:00 PM   Specimen: Nasopharyngeal Swab  Result Value Ref Range Status   SARS Coronavirus 2 NEGATIVE NEGATIVE Final    Comment: (NOTE) SARS-CoV-2 target nucleic acids are NOT DETECTED.  The SARS-CoV-2 RNA is generally detectable in upper and lower respiratory specimens during the acute phase of infection. The lowest concentration of SARS-CoV-2 viral copies this assay can detect is 250 copies / mL. A negative result does not preclude SARS-CoV-2 infection and should not be used as the sole basis for treatment or other patient management decisions.  A negative result may occur with improper specimen collection / handling, submission of specimen other than nasopharyngeal swab, presence of viral mutation(s) within the areas targeted by this assay, and inadequate number of viral copies (<250 copies / mL). A negative result must be combined with clinical observations, patient history, and epidemiological information.  Fact Sheet for Patients:   StrictlyIdeas.no  Fact Sheet for Healthcare  Providers: BankingDealers.co.za  This test is not yet approved or  cleared by the [place] FDA and has been authorized for detection and/or diagnosis of SARS-CoV-2 by FDA under an Emergency Use Authorization (EUA).  This EUA will remain in effect (meaning this test can be used) for the duration of the COVID-19 declaration under Section 564(b)(1) of the Act, 21 U.S.C. section 360bbb-3(b)(1), unless the authorization is terminated or revoked sooner.  Performed at [facility], [place] [address] [place], [place] [address]   Culture, body fluid-bottle     Status: None   Collection Time: 01/25/20  1:07 PM   Specimen: Pleura  Result Value Ref Range Status   Specimen Description PLEURAL  Final   Special Requests BOTTLES DRAWN AEROBIC AND ANAEROBIC  Final   Culture   Final    NO GROWTH 5 DAYS Performed at [facility], [place] [address], [place], [place] [address]    Report Status 01/30/2020 FINAL  Final  Gram stain     Status: None   Collection Time: 01/25/20  1:07 PM   Specimen: Pleura  Result Value Ref Range Status   Specimen Description PLEURAL  Final   Special Requests NONE  Final   Gram Stain   Final    RARE WBC PRESENT, PREDOMINANTLY MONONUCLEAR NO ORGANISMS SEEN Performed at [facility], [place] [address], [place], [place] [address]    Report Status 01/26/2020 FINAL  Final  Culture, respiratory     Status: None (Preliminary result)   Collection Time: 01/29/20  1:12 PM   Specimen: Bronchial Washing, Right; Respiratory  Result  Value Ref Range Status   Specimen Description   Final    BRONCHIAL ALVEOLAR LAVAGE Performed at [place] [address], [place], [place] [address]    Special Requests   Final    NONE Performed at [facility], [place] [address], [place], [place] [address]    Gram Stain   Final    RARE WBC PRESENT, PREDOMINANTLY PMN NO ORGANISMS SEEN    Culture   Final     FEW Consistent with normal respiratory flora. Performed at [facility], [place] [address] [place], [place] [address]    Report Status PENDING  Incomplete     Time coordinating discharge: 34 minutes.   SIGNED:   Hosie Poisson, MD  [facility] 01/31/2020, 1:58 PM

## 2020-01-31 NOTE — Progress Notes (Signed)
I completed an initial visit with the patient. I visited the patient's room and found him sitting in the chair beside the bed. I asked how he was doing and he replied that he was preparing to go home today. He said he did not need a visit from the Chaplain at this time. I shared brief words of encouragement about going home and that he enjoy a great day.     01/31/20 1240  Clinical Encounter Type  Visited With Patient  Visit Type Initial;Spiritual support  Referral From Nurse  Consult/Referral To Chaplain  Spiritual Encounters  Spiritual Needs Prayer    Chaplain Dr Redgie Grayer

## 2020-01-31 NOTE — Progress Notes (Signed)
  Radiation Oncology         [phone] ________________________________  Name: Jack Thompson MRN: [id number]  Date: 01/31/2020  DOB: 1945/12/03  INPATIENT  SIMULATION AND TREATMENT PLANNING NOTE    ICD-10-CM   1. 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  Identity was confirmed.  All relevant records and images related to the planned course of therapy were reviewed.  The patient freely provided informed written consent to proceed with treatment after reviewing the details related to the planned course of therapy. The consent form was witnessed and verified by the simulation staff.  Then, the patient was set-up in a stable reproducible  supine position for radiation therapy.  CT images were obtained.  Surface markings were placed.  The CT images were loaded into the planning software.  Then the target and avoidance structures were contoured.  Treatment planning then occurred.  The radiation prescription was entered and confirmed.  Then, I designed and supervised the construction of a total of 3 medically necessary complex treatment devices, including 3 multileaf collimators conformally shaped radiation around the treatment target while shielding critical structures such as the heart and spinal cord maximally.  I have requested : 3D Simulation  I have requested a DVH of the following structures: Left lung, right lung, spinal cord, heart, esophagus, and target.    PLAN:  The patient will receive 30 Gy in 10 fractions.  ________________________________  Sheral Apley Tammi Klippel, M.D.

## 2020-01-31 NOTE — Progress Notes (Signed)
SATURATION QUALIFICATIONS: (This note is used to comply with regulatory documentation for home oxygen)  Patient Saturations on Room Air at Rest = 91%  Patient Saturations on Room Air while Ambulating = 88%  Patient Saturations on 2 Liters of oxygen while Ambulating = 94%  Please briefly explain why patient needs home oxygen:Pt desaturates when ambulating on room air

## 2020-01-31 NOTE — Progress Notes (Addendum)
I called the patient's son Kasandra Knudsen and the patient. I reviewed the MRI results and plans to proceed with simulation today and treatment tomorrow. PET would be ordered when he's outpt.    Carola Rhine, PAC

## 2020-01-31 NOTE — TOC Transition Note (Signed)
Transition of Care [facility]) - CM/SW Discharge Note   Patient Details  Name: Jack Thompson MRN: [id number] Date of Birth: 01-09-1946  [facility] of [facility]) CM/SW Contact:  Dessa Phi, RN Phone Number: 01/31/2020, 1:36 PM   Clinical Narrative: neb machine ordered-[employer] to deliver to rm prior dc.       Final next level of care: Home/Self Care Barriers to Discharge: No Barriers Identified   Patient Goals and CMS Choice Patient states their goals for this hospitalization and ongoing recovery are:: go home CMS Medicare.gov Compare Post Acute Care list provided to:: Patient    Discharge Placement                       Discharge Plan and Services   Discharge Planning Services: CM Consult            DME Arranged: Nebulizer machine DME Agency: [employer] Date DME Agency Contacted: 01/31/20 Time DME Agency Contacted: [phone] Representative spoke with at DME Agency: [place] ([place]) Interventions     Readmission Risk Interventions No flowsheet data found.

## 2020-01-31 NOTE — Telephone Encounter (Signed)
Called patient no answer.

## 2020-01-31 NOTE — TOC Transition Note (Signed)
Transition of Care [facility]) - CM/SW Discharge Note   Patient Details  Name: Jack Thompson MRN: [id number] Date of Birth: 12-23-1945  Transition [facility]) CM/SW Contact:  Dessa Phi, RN Phone Number: 01/31/2020, 10:47 AM   Clinical Narrative: Qualifies for home 20-[employer] rep Zach aware of 02 sats-await home 02 order to deliver travel tank to rm prior d/c. No further CM needs.      Final next level of care: Home/Self Care Barriers to Discharge: No Barriers Identified   Patient Goals and CMS Choice Patient states their goals for this hospitalization and ongoing recovery are:: go home CMS Medicare.gov Compare Post Acute Care list provided to:: Patient    Discharge Placement                       Discharge Plan and Services   Discharge Planning Services: CM Consult            DME Arranged: Oxygen DME Agency: [employer] Date DME Agency Contacted: 01/31/20 Time DME Agency Contacted: [phone] Representative spoke with at DME Agency: [place] ([place]) Interventions     Readmission Risk Interventions No flowsheet data found.

## 2020-02-01 ENCOUNTER — Telehealth: Payer: Self-pay | Admitting: *Deleted

## 2020-02-01 ENCOUNTER — Encounter: Payer: Self-pay | Admitting: General Practice

## 2020-02-01 ENCOUNTER — Encounter: Payer: Self-pay | Admitting: Hematology and Oncology

## 2020-02-01 ENCOUNTER — Ambulatory Visit
Admission: RE | Admit: 2020-02-01 | Discharge: 2020-02-01 | Disposition: A | Discharge status: Home / Self Care | Payer: Medicare HMO | Source: Ambulatory Visit | Attending: Radiation Oncology | Admitting: Radiation Oncology

## 2020-02-01 ENCOUNTER — Other Ambulatory Visit: Payer: Self-pay

## 2020-02-01 DIAGNOSIS — C3431 Malignant neoplasm of lower lobe, right bronchus or lung: Secondary | ICD-10-CM | POA: Diagnosis not present

## 2020-02-01 DIAGNOSIS — C3401 Malignant neoplasm of right main bronchus: Secondary | ICD-10-CM | POA: Diagnosis not present

## 2020-02-01 DIAGNOSIS — Z510 Encounter for antineoplastic radiation therapy: Secondary | ICD-10-CM | POA: Diagnosis present

## 2020-02-01 LAB — CULTURE, RESPIRATORY W GRAM STAIN: Culture: NORMAL

## 2020-02-01 NOTE — Telephone Encounter (Signed)
I called patient but was unable to reach. I did leave vm message with my name and phone number to call.

## 2020-02-01 NOTE — Progress Notes (Signed)
[place] CSW Progress Notes  Call from son, Emerick Weatherly [phone]) - son lives in [place] and is trying to coordinate care needed for father.  Father lives alone, is widowed, no other famliy members in [place].  Son concerned about getting to/from appointments - referred to [facility] and CSW will also complete referral to [place] for city transit help.  Son asks about in home caregiver support - CSW explained that unless patient has Medicaid, these resources would be private pay arrangements - son has already contacted several in home personal care agencies.  Per son, patient is significantly fatigued and has difficulty Set designer.  Does not answer phones reliably.  Son also wants to get patient a portable oxygen tank to use while traveling, was discharged with larger tank which is difficult for patient to handle.  CSW advised that patient may need to work w PCP office in order to accomplish any DME or [place] needs but will be seen by medical oncologist next week and can discuss these needs at that time.  CSW will complete referral to [employer] [facility] for their transportation help.  Have also referred to [facility] for immediate needs for rides.   Edwyna Shell, LCSW Clinical Social Worker Phone:  [phone] Cell:  [phone]

## 2020-02-02 ENCOUNTER — Ambulatory Visit
Admission: RE | Admit: 2020-02-02 | Discharge: 2020-02-02 | Disposition: A | Discharge status: Home / Self Care | Payer: Medicare HMO | Source: Ambulatory Visit | Attending: Radiation Oncology | Admitting: Radiation Oncology

## 2020-02-02 ENCOUNTER — Other Ambulatory Visit: Payer: Self-pay

## 2020-02-02 ENCOUNTER — Other Ambulatory Visit: Payer: Self-pay | Admitting: Radiation Oncology

## 2020-02-02 ENCOUNTER — Other Ambulatory Visit: Payer: Medicare Other

## 2020-02-02 ENCOUNTER — Telehealth: Payer: Self-pay | Admitting: Radiation Oncology

## 2020-02-02 ENCOUNTER — Telehealth: Payer: Self-pay | Admitting: *Deleted

## 2020-02-02 ENCOUNTER — Ambulatory Visit: Payer: Medicare Other | Admitting: Hematology and Oncology

## 2020-02-02 DIAGNOSIS — Z510 Encounter for antineoplastic radiation therapy: Secondary | ICD-10-CM | POA: Diagnosis not present

## 2020-02-02 DIAGNOSIS — C3431 Malignant neoplasm of lower lobe, right bronchus or lung: Secondary | ICD-10-CM | POA: Diagnosis not present

## 2020-02-02 DIAGNOSIS — J90 Pleural effusion, not elsewhere classified: Secondary | ICD-10-CM | POA: Diagnosis not present

## 2020-02-02 DIAGNOSIS — C3401 Malignant neoplasm of right main bronchus: Secondary | ICD-10-CM | POA: Diagnosis not present

## 2020-02-02 DIAGNOSIS — C3490 Malignant neoplasm of unspecified part of unspecified bronchus or lung: Secondary | ICD-10-CM

## 2020-02-02 DIAGNOSIS — J9601 Acute respiratory failure with hypoxia: Secondary | ICD-10-CM | POA: Diagnosis not present

## 2020-02-02 DIAGNOSIS — I10 Essential (primary) hypertension: Secondary | ICD-10-CM | POA: Diagnosis not present

## 2020-02-02 DIAGNOSIS — R05 Cough: Secondary | ICD-10-CM | POA: Diagnosis not present

## 2020-02-02 NOTE — Telephone Encounter (Signed)
Mr. Jack Thompson called me.  I schedule him to be seen with Dr. Julien Nordmann next week. He verbalized understanding of appt time and place.

## 2020-02-02 NOTE — Telephone Encounter (Signed)
Patient seen for PUT encounter earlier today. Patient verbalized during encounter his home oxygen wasn't working properly thus he was unable to use it. Patient verbalized intent to phone the supplier for assistance upon arrival home from radiation today.   Phoned Bethel Born at [facility] [phone]) to ensure patient reached out. Bethel Born confirms a work order was entered today at [employer] after the patient phoned in. Bethel Born explains their technician respond within thirty minutes of contact. Will inform Edwyna Shell, LCSW of these findings.

## 2020-02-02 NOTE — Telephone Encounter (Signed)
I called Jack Thompson today.  I was unable to reach but did leave vm message with my name and phone number to call.

## 2020-02-05 ENCOUNTER — Ambulatory Visit
Admission: RE | Admit: 2020-02-05 | Discharge: 2020-02-05 | Disposition: A | Discharge status: Home / Self Care | Payer: Medicare HMO | Source: Ambulatory Visit | Attending: Radiation Oncology | Admitting: Radiation Oncology

## 2020-02-05 ENCOUNTER — Other Ambulatory Visit: Payer: Self-pay

## 2020-02-05 DIAGNOSIS — Z510 Encounter for antineoplastic radiation therapy: Secondary | ICD-10-CM | POA: Insufficient documentation

## 2020-02-05 DIAGNOSIS — C3401 Malignant neoplasm of right main bronchus: Secondary | ICD-10-CM | POA: Diagnosis not present

## 2020-02-05 DIAGNOSIS — C3431 Malignant neoplasm of lower lobe, right bronchus or lung: Secondary | ICD-10-CM | POA: Diagnosis not present

## 2020-02-06 ENCOUNTER — Ambulatory Visit
Admission: RE | Admit: 2020-02-06 | Discharge: 2020-02-06 | Disposition: A | Discharge status: Home / Self Care | Payer: Medicare HMO | Source: Ambulatory Visit | Attending: Radiation Oncology | Admitting: Radiation Oncology

## 2020-02-06 ENCOUNTER — Other Ambulatory Visit: Payer: Self-pay

## 2020-02-06 ENCOUNTER — Encounter: Payer: Self-pay | Admitting: Internal Medicine

## 2020-02-06 DIAGNOSIS — Z510 Encounter for antineoplastic radiation therapy: Secondary | ICD-10-CM | POA: Diagnosis not present

## 2020-02-06 DIAGNOSIS — C3431 Malignant neoplasm of lower lobe, right bronchus or lung: Secondary | ICD-10-CM | POA: Diagnosis not present

## 2020-02-06 DIAGNOSIS — C3401 Malignant neoplasm of right main bronchus: Secondary | ICD-10-CM | POA: Diagnosis not present

## 2020-02-07 ENCOUNTER — Telehealth: Payer: Self-pay | Admitting: Radiation Oncology

## 2020-02-07 ENCOUNTER — Ambulatory Visit
Admission: RE | Admit: 2020-02-07 | Discharge: 2020-02-07 | Disposition: A | Discharge status: Home / Self Care | Payer: Medicare HMO | Source: Ambulatory Visit | Attending: Radiation Oncology | Admitting: Radiation Oncology

## 2020-02-07 ENCOUNTER — Other Ambulatory Visit: Payer: Self-pay

## 2020-02-07 DIAGNOSIS — Z510 Encounter for antineoplastic radiation therapy: Secondary | ICD-10-CM | POA: Diagnosis not present

## 2020-02-07 DIAGNOSIS — C3431 Malignant neoplasm of lower lobe, right bronchus or lung: Secondary | ICD-10-CM | POA: Diagnosis not present

## 2020-02-07 DIAGNOSIS — C3401 Malignant neoplasm of right main bronchus: Secondary | ICD-10-CM | POA: Diagnosis not present

## 2020-02-07 NOTE — Telephone Encounter (Signed)
Phoned patient to inquire about status. Patient confirms his oxygen therapy is working properly now. Patient endorses "breathing better." Patient denies additional needs at this time.

## 2020-02-07 NOTE — Telephone Encounter (Signed)
-----   Message from Beverely Pace, [place] sent at 02/02/2020  3:32 PM EDT ----- Looks like the [facility] notes by the RN CM before DC said that it was [employer] for nebulizer and home O2, also that he was to have a travel tank provided by [employer] prior to inpt dc.  Wonder if that even happened?  I dont have a contact for [employer], but Im sure someone does... Anne ----- Message ----- From: Heywood Footman, RN Sent: 02/02/2020   3:17 PM EDT To: Beverely Pace, LCSW  Thank you, Webb Silversmith. We saw the patient briefly for PUT today. Patient has oxygen at home but it isn't working. He verbalized intent to call company upon arrival home today. He has been using tanks of oxygen since discharge but only intermittently. I stressed he should wear the oxygen continuously. He verbalized understanding. The patient didn't verbalize any additional needs to [place].   Sam ----- Message ----- From: Justice Britain Sent: 02/01/2020   3:17 PM EDT To: Heywood Footman, RN  FYI.  Not sure if Dr Tammi Klippel would want to help son/patient.  Tx Anne ----- Message ----- From: Justice Britain Sent: 02/01/2020   3:04 PM EDT To: Otila Kluver, RN  Just Juluis Rainier - got a call from son.  Pt set to see [facility] 8/5.  Was discharged from inpt yesterday unexpectedly per son.  Things are wonky at home.  Pt lives alone, son is in [place].  Pt needs portable O2 tank and may have other needs.... I told son that when he sees [place] on 8/5, these could be addressed but until then theyd have to work through their PCP...aM I correct??  Webb Silversmith

## 2020-02-08 ENCOUNTER — Inpatient Hospital Stay: Payer: Medicare HMO | Admitting: Hematology and Oncology

## 2020-02-08 ENCOUNTER — Inpatient Hospital Stay: Payer: Medicare HMO

## 2020-02-08 ENCOUNTER — Other Ambulatory Visit: Payer: Medicare Other | Admitting: General Practice

## 2020-02-08 ENCOUNTER — Other Ambulatory Visit: Payer: Self-pay

## 2020-02-08 ENCOUNTER — Ambulatory Visit
Admission: RE | Admit: 2020-02-08 | Discharge: 2020-02-08 | Disposition: A | Discharge status: Home / Self Care | Payer: Medicare HMO | Source: Ambulatory Visit | Attending: Radiation Oncology | Admitting: Radiation Oncology

## 2020-02-08 DIAGNOSIS — C3431 Malignant neoplasm of lower lobe, right bronchus or lung: Secondary | ICD-10-CM | POA: Diagnosis not present

## 2020-02-08 DIAGNOSIS — Z510 Encounter for antineoplastic radiation therapy: Secondary | ICD-10-CM | POA: Diagnosis not present

## 2020-02-08 DIAGNOSIS — C3401 Malignant neoplasm of right main bronchus: Secondary | ICD-10-CM | POA: Diagnosis not present

## 2020-02-09 ENCOUNTER — Inpatient Hospital Stay (HOSPITAL_BASED_OUTPATIENT_CLINIC_OR_DEPARTMENT_OTHER): Payer: Medicare HMO | Admitting: Internal Medicine

## 2020-02-09 ENCOUNTER — Telehealth: Payer: Self-pay | Admitting: General Practice

## 2020-02-09 ENCOUNTER — Other Ambulatory Visit: Payer: Self-pay

## 2020-02-09 ENCOUNTER — Ambulatory Visit
Admission: RE | Admit: 2020-02-09 | Discharge: 2020-02-09 | Disposition: A | Discharge status: Home / Self Care | Payer: Medicare HMO | Source: Ambulatory Visit | Attending: Radiation Oncology | Admitting: Radiation Oncology

## 2020-02-09 ENCOUNTER — Encounter: Payer: Self-pay | Admitting: General Practice

## 2020-02-09 ENCOUNTER — Inpatient Hospital Stay: Payer: Medicare HMO | Attending: Internal Medicine

## 2020-02-09 ENCOUNTER — Inpatient Hospital Stay: Payer: Medicare HMO | Admitting: General Practice

## 2020-02-09 DIAGNOSIS — Z7189 Other specified counseling: Secondary | ICD-10-CM | POA: Diagnosis not present

## 2020-02-09 DIAGNOSIS — C3491 Malignant neoplasm of unspecified part of right bronchus or lung: Secondary | ICD-10-CM | POA: Diagnosis not present

## 2020-02-09 DIAGNOSIS — C3431 Malignant neoplasm of lower lobe, right bronchus or lung: Secondary | ICD-10-CM

## 2020-02-09 DIAGNOSIS — C3401 Malignant neoplasm of right main bronchus: Secondary | ICD-10-CM | POA: Diagnosis not present

## 2020-02-09 DIAGNOSIS — Z5112 Encounter for antineoplastic immunotherapy: Secondary | ICD-10-CM | POA: Diagnosis not present

## 2020-02-09 DIAGNOSIS — Z510 Encounter for antineoplastic radiation therapy: Secondary | ICD-10-CM | POA: Diagnosis not present

## 2020-02-09 DIAGNOSIS — Z5111 Encounter for antineoplastic chemotherapy: Secondary | ICD-10-CM | POA: Diagnosis not present

## 2020-02-09 LAB — CMP (CANCER CENTER ONLY)
ALT: 34 U/L (ref 0–44)
AST: 43 U/L — ABNORMAL HIGH (ref 15–41)
Albumin: 1.7 g/dL — ABNORMAL LOW (ref 3.5–5.0)
Alkaline Phosphatase: 114 U/L (ref 38–126)
Anion gap: 11 (ref 5–15)
BUN: 24 mg/dL — ABNORMAL HIGH (ref 8–23)
CO2: 28 mmol/L (ref 22–32)
Calcium: 8.9 mg/dL (ref 8.9–10.3)
Chloride: 92 mmol/L — ABNORMAL LOW (ref 98–111)
Creatinine: 0.86 mg/dL (ref 0.61–1.24)
GFR, Est AFR Am: >60 mL/min (ref >60)
GFR, Estimated: >60 mL/min (ref >60)
Glucose, Bld: 115 mg/dL — ABNORMAL HIGH (ref 70–99)
Potassium: 4 mmol/L (ref 3.5–5.1)
Sodium: 131 mmol/L — ABNORMAL LOW (ref 135–145)
Total Bilirubin: 0.5 mg/dL (ref 0.3–1.2)
Total Protein: 7.2 g/dL (ref 6.5–8.1)

## 2020-02-09 LAB — CBC WITH DIFFERENTIAL (CANCER CENTER ONLY)
Abs Immature Granulocytes: 0.12 10*3/uL — ABNORMAL HIGH (ref 0.00–0.07)
Basophils Absolute: 0 10*3/uL (ref 0.0–0.1)
Basophils Relative: 0 %
Eosinophils Absolute: 0.1 10*3/uL (ref 0.0–0.5)
Eosinophils Relative: 1 %
HCT: 34.2 % — ABNORMAL LOW (ref 39.0–52.0)
Hemoglobin: 11.2 g/dL — ABNORMAL LOW (ref 13.0–17.0)
Immature Granulocytes: 1 %
Lymphocytes Relative: 3 %
Lymphs Abs: 0.5 10*3/uL — ABNORMAL LOW (ref 0.7–4.0)
MCH: 30.2 pg (ref 26.0–34.0)
MCHC: 32.7 g/dL (ref 30.0–36.0)
MCV: 92.2 fL (ref 80.0–100.0)
Monocytes Absolute: 0.8 10*3/uL (ref 0.1–1.0)
Monocytes Relative: 5 %
Neutro Abs: 13.8 10*3/uL — ABNORMAL HIGH (ref 1.7–7.7)
Neutrophils Relative %: 90 %
Platelet Count: 405 10*3/uL — ABNORMAL HIGH (ref 150–400)
RBC: 3.71 MIL/uL — ABNORMAL LOW (ref 4.22–5.81)
RDW: 15.9 % — ABNORMAL HIGH (ref 11.5–15.5)
WBC Count: 15.3 10*3/uL — ABNORMAL HIGH (ref 4.0–10.5)
nRBC: 0 % (ref 0.0–0.2)

## 2020-02-09 MED ORDER — HYDROCOD POLST-CPM POLST ER 10-8 MG/5ML PO SUER: 5.0000 mL | Freq: Two times a day (BID) | ORAL | 0 refills | Status: DC | PRN

## 2020-02-09 MED ORDER — PROCHLORPERAZINE MALEATE 10 MG PO TABS
10.0000 mg | ORAL_TABLET | Freq: Four times a day (QID) | ORAL | 0 refills | Status: AC | PRN
Start: 2020-02-09 — End: ?

## 2020-02-09 NOTE — Telephone Encounter (Signed)
[place] CSW Progress Notes  Call from son Kasandra Knudsen who lives in [place].  He had hoped to be called during father's appointment today.  He has concerns about father's needs at home, including whether he is able to manage his home oxygen and nebulizer, whether he can get around at home safely, what other supports father needs.  Is primarily concerned about "what happens if his oxygen runs out - one of his friends noticed that his oxygen was giving an error, he had called [employer] and nothing happened."  Son worried about how to handle these medical issues from out of town.  Son has arranged housekeeping, food delivery, laundry and similar.  Messaged desk RN and asked for them to call son.  Edwyna Shell, LCSW Clinical Social Worker Phone:  [phone] Cell:  [phone]

## 2020-02-09 NOTE — Progress Notes (Signed)
[place] [phone]   [phone]  CONSULT NOTE  REFERRING PHYSICIAN: Dr. Marshell Garfinkel  REASON FOR CONSULTATION:  74 years old white male with recently diagnosed lung cancer.  HPI Jack Thompson is a 74 y.o. male with past medical history significant for hypertension, hypothyroidism, osteoarthritis as well as long history for smoking but quit 2.5 years ago.  The patient mentioned that he has been complaining of shortness of breath and chest pain for a while.  He was treated with Mucinex and Combivent with no improvement of his symptoms. He was seen at the emergency department on 01/24/2020 complaining of the above symptoms.  Chest x-ray performed on 01/24/2020 showed diminished aeration to the right lower lobe due to pleural effusion, atelectasis and airspace consolidation.  Underlying mass lesion would be difficult to exclude.  CT angiogram of the chest performed on the same day showed right infrahilar to hilar mass with metastatic adenopathy in the right hilum and paratracheal region.  There was also small bilateral adrenal masses that could be adenomas or metastasis.  There was lytic destructive lesion of the left lateral fourth rib highly suspicious to be metastatic lesion.  There was also moderate sized right effusion with dependent atelectasis.  There was also density in the adjacent right middle lobe and right upper lobe could be atelectasis, pneumonia or interstitial spread of carcinoma.  The patient underwent ultrasound-guided right thoracentesis on 01/25/2020 with drainage of 1.2 L of pleural fluid.  The cytology showed no malignant cells and only reactive mesothelial cells present.  The patient was seen by Dr. Vaughan Browner and he underwent bronchoscopy on 01/29/2020 with biopsy of the right lower lobe lung mass.  The final cytology ([id number]) showed malignant cells consistent with squamous cell carcinoma.  The patient also had CT scan of the head without  contrast on 01/27/2020 that showed no evidence of acute intracranial abnormalities.  He also had MRI of the brain on 01/30/2020 that showed no metastatic disease to the brain or acute intracranial abnormalities.  Repeat ultrasound-guided thoracentesis on 01/30/2020 drain 850 mL of straw-colored fluid.  The patient is scheduled to have a PET scan on 02/14/2020. He was seen by Dr. Lisbeth Renshaw and currently undergoing palliative radiotherapy to the obstructive right hilar mass and expected to complete this treatment next week. The patient was referred to me today for evaluation and recommendation regarding treatment of his condition. When seen today he continues to have cough as well as shortness of breath especially when leaning back.  He also lost around 15 pounds in the last 3 months.  He continues to have swelling of the lower extremities.  He denied having any chest pain or hemoptysis.  He has no nausea, vomiting, diarrhea or constipation.  He has no headache or visual changes.  The patient also continues to complain of fatigue and weakness. Family history significant for father died from aspiration pneumonia.  Mother died from a heart surgery and two sister were diagnosed with cancer. The patient is a widow his wife died from colon cancer last year.  He has two sons and two stepchildren his son and daughter.  He used to do concrete work.  He has a history of smoking more than 1 pack/day for around 45 years and quit 2.5 years ago.  He also used to drink 6 pack of alcohol weekly.  No history of drug abuse.   HPI  Past Medical History:  Diagnosis Date  . Arthritis   . Hypertension   .  Hypothyroidism   . Subcutaneous cysts, generalized    right thigh and lt buttock    Past Surgical History:  Procedure Laterality Date  . BIOPSY  01/29/2020   Procedure: BIOPSY;  Surgeon: Marshell Garfinkel, MD;  Location: [facility];  Service: Cardiopulmonary;;  . CYST EXCISION N/A 05/02/2015   Procedure: EXCISION OF CYST  RIGHT THIGH AND BUTTOCK;  Surgeon: Armandina Gemma, MD;  Location: [facility];  Service: General;  Laterality: N/A;  . ENDOBRONCHIAL ULTRASOUND Bilateral 01/29/2020   Procedure: ENDOBRONCHIAL ULTRASOUND;  Surgeon: Marshell Garfinkel, MD;  Location: [facility];  Service: Cardiopulmonary;  Laterality: Bilateral;  . FINE NEEDLE ASPIRATION  01/29/2020   Procedure: FINE NEEDLE ASPIRATION;  Surgeon: Marshell Garfinkel, MD;  Location: [facility];  Service: Cardiopulmonary;;  . TONSILLECTOMY    . VIDEO BRONCHOSCOPY  01/29/2020   Procedure: VIDEO BRONCHOSCOPY;  Surgeon: Marshell Garfinkel, MD;  Location: [facility];  Service: Cardiopulmonary;;    No family history on file.  Social History Social History   Tobacco Use  . Smoking status: Current Every Day Smoker    Packs/day: 1.00  . Smokeless tobacco: Never Used  Vaping Use  . Vaping Use: Never used  Substance Use Topics  . Alcohol use: Yes    Comment: social  . Drug use: No    No Known Allergies  Current Outpatient Medications  Medication Sig Dispense Refill  . budesonide (PULMICORT) 0.5 MG/2ML nebulizer solution Take 2 mLs (0.5 mg total) by nebulization 2 (two) times daily. 50 mL 12  . COMBIVENT RESPIMAT 20-100 MCG/ACT AERS respimat Inhale 2 puffs into the lungs every 6 (six) hours as needed for wheezing or shortness of breath.     . guaiFENesin (MUCINEX) 600 MG 12 hr tablet Take 600 mg by mouth 2 (two) times daily as needed for cough.    . levothyroxine (SYNTHROID) 300 MCG tablet Take 300 mcg by mouth. MWF and Sat    . amLODipine (NORVASC) 10 MG tablet Take 1 tablet (10 mg total) by mouth daily. (Patient not taking: Reported on 02/09/2020) 30 tablet 1  . chlorpheniramine-HYDROcodone (TUSSIONEX) 10-8 MG/5ML SUER Take 5 mLs by mouth every 12 (twelve) hours as needed for cough. (Patient not taking: Reported on 02/09/2020) 140 mL 0   No current facility-administered medications for this visit.    Review of Systems  Constitutional:  positive for anorexia, fatigue and weight loss Eyes: negative Ears, nose, mouth, throat, and face: negative Respiratory: positive for cough and dyspnea on exertion Cardiovascular: negative Gastrointestinal: negative Genitourinary:negative Integument/breast: negative Hematologic/lymphatic: negative Musculoskeletal:negative Neurological: negative Behavioral/Psych: negative Endocrine: negative Allergic/Immunologic: negative  Physical Exam  RDE:YCXKG, healthy, no distress, well nourished, well developed and anxious SKIN: skin color, texture, turgor are normal, no rashes or significant lesions HEAD: Normocephalic, No masses, lesions, tenderness or abnormalities EYES: normal, PERRLA, Conjunctiva are pink and non-injected EARS: External ears normal, Canals clear OROPHARYNX:no exudate, no erythema and lips, buccal mucosa, and tongue normal  NECK: supple, no adenopathy, no JVD LYMPH:  no palpable lymphadenopathy, no hepatosplenomegaly LUNGS: clear to auscultation , and palpation HEART: regular rate & rhythm, no murmurs and no gallops ABDOMEN:abdomen soft, non-tender, normal bowel sounds and no masses or organomegaly BACK: No CVA tenderness, Range of motion is normal EXTREMITIES:no joint deformities, effusion, or inflammation, no edema  NEURO: alert & oriented x 3 with fluent speech, no focal motor/sensory deficits  PERFORMANCE STATUS: ECOG 1  LABORATORY DATA: Lab Results  Component Value Date   WBC 15.3 (H) 02/09/2020   HGB  11.2 (L) 02/09/2020   HCT 34.2 (L) 02/09/2020   MCV 92.2 02/09/2020   PLT 405 (H) 02/09/2020      Chemistry      Component Value Date/Time   NA 131 (L) 02/09/2020 0823   K 4.0 02/09/2020 0823   CL 92 (L) 02/09/2020 0823   CO2 28 02/09/2020 0823   BUN 24 (H) 02/09/2020 0823   CREATININE 0.86 02/09/2020 0823      Component Value Date/Time   CALCIUM 8.9 02/09/2020 0823   ALKPHOS 114 02/09/2020 0823   AST 43 (H) 02/09/2020 0823   ALT 34 02/09/2020 0823    BILITOT 0.5 02/09/2020 0823       RADIOGRAPHIC STUDIES: DG Chest 1 View  Result Date: 01/25/2020 CLINICAL DATA:  Status post right-sided thoracentesis. EXAM: CHEST  1 VIEW COMPARISON:  Ultrasound 01/25/2020. Chest x-ray 01/24/2020. Chest CT 01/24/2020. FINDINGS: Mediastinum is stable. Persistent right lower lobe atelectasis/consolidation. Reference is made to recent CT report for discussion of underlying mass. Small right pleural effusion remains after pneumothorax. No pneumothorax. Persistent left base atelectasis. Reference is made to CT report for discussion of left rib lesion. IMPRESSION: 1. No evidence of pneumothorax post thoracentesis. Small residual right pleural effusion. No pneumothorax 2. Persistent right lower lobe atelectasis/consolidation. Reference is made to recent CT report for discussion of underlying mass. Reference is made to CT report for discussion of left rib lesion. 2.  Persistent left base atelectasis. Electronically Signed   By: Marcello Moores  Register   On: 01/25/2020 13:33   DG Chest 2 View  Result Date: 01/24/2020 CLINICAL DATA:  Short of breath. EXAM: CHEST - 2 VIEW COMPARISON:  None FINDINGS: Mild cardiac enlargement. Small left pleural effusion. Significantly diminished aeration to the right lower lobe is favored to represent a combination of pleural effusion, atelectasis and airspace consolidation. No airspace opacities identified within the left lung. Remote right lower posterior rib deformities. IMPRESSION: Diminished aeration to the right lower lobe likely due to pleural effusion, atelectasis and airspace consolidation. Underlying mass lesion would be difficult to exclude. Consider further evaluation with CT of the chest with contrast material. Electronically Signed   By: Kerby Moors M.D.   On: 01/24/2020 11:59   CT HEAD WO CONTRAST  Result Date: 01/27/2020 CLINICAL DATA:  Delirium.  Lung mass. EXAM: CT HEAD WITHOUT CONTRAST TECHNIQUE: Contiguous axial images were  obtained from the base of the skull through the vertex without intravenous contrast. COMPARISON:  03/16/2011 FINDINGS: Brain: No acute cortically based infarct, intracranial hemorrhage, mass, midline shift, or extra-axial fluid collection is identified. Patchy hypodensities in the cerebral white matter bilaterally are new and nonspecific but compatible with moderate chronic small vessel ischemic disease. 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 The ventricles and sulci are within normal limits for age. Vascular: Calcified atherosclerosis at the skull base. No hyperdense vessel. Skull: No fracture or suspicious osseous lesion. Sinuses/Orbits: Visualized paranasal sinuses and mastoid air cells are clear. Unremarkable orbits. Other: None. IMPRESSION: 1. No evidence of acute intracranial abnormality. 2. Moderate chronic small vessel ischemic disease, new from 2012. Electronically Signed   By: Logan Bores M.D.   On: 01/27/2020 11:39   CT Angio Chest PE W and/or Wo Contrast  Result Date: 01/24/2020 CLINICAL DATA:  Chest pain and shortness of breath over the last 2 months. EXAM: CT ANGIOGRAPHY CHEST WITH CONTRAST TECHNIQUE: Multidetector CT imaging of the chest was performed using the standard protocol during bolus administration of intravenous contrast. Multiplanar CT  image reconstructions and MIPs were obtained to evaluate the vascular anatomy. CONTRAST:  113m OMNIPAQUE IOHEXOL 350 MG/ML SOLN COMPARISON:  Chest radiography same day FINDINGS: Cardiovascular: Pulmonary arterial opacification is good. No visible pulmonary emboli. There is aortic atherosclerosis without aneurysm or dissection. Heart size is normal. Coronary artery calcification is noted. Mediastinum/Nodes: Suspicion of a right infrahilar to hilar mass with right hilar adenopathy and right paratracheal adenopathy. Lungs/Pleura: As above, there is suspicion of a right hilar to infrahilar mass, possibly on the order of 4-5 cm in  size, with right hilar and paratracheal adenopathy. There is a moderate pleural effusion on the right layering dependently. Right lower lobe is collapsed. There is airspace filling and interstitial prominence in the right middle lobe and dependent right upper lobe. No focal finding on the left. Upper Abdomen: Mild enlargement of the right adrenal gland with an area measuring approximately 10 x 19 mm that could be a metastasis or adenoma. Similar prominence of the inferior limb of the left adrenal gland. Areas of low-density in the liver are indeterminate and could be cysts, hemangiomas or metastatic disease. Musculoskeletal: Lytic destructive lesion of the left lateral fourth rib worrisome for bony metastatic disease. I do not see a spinal or sternal lesion. Review of the MIP images confirms the above findings. 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 Small bilateral adrenal masses that could be adenomas or metastases. Lytic destructive lesion of the left lateral fourth rib highly likely to be a metastatic lesion. Moderate size right effusion with dependent atelectasis. Density in the adjacent right middle lobe and right upper lobe could be atelectasis, pneumonia or interstitial spread of carcinoma. Aortic Atherosclerosis (ICD10-I70.0). Coronary artery calcification also present. Electronically Signed   By: MNelson ChimesM.D.   On: 01/24/2020 17:15   MR BRAIN W WO CONTRAST  Result Date: 01/30/2020 CLINICAL DATA:  74year old male with recently diagnosed right hilum/lung mass. Staging of non-small cell lung cancer. EXAM: MRI HEAD WITHOUT AND WITH CONTRAST TECHNIQUE: Multiplanar, multiecho pulse sequences of the brain and surrounding structures were obtained without and with intravenous contrast. CONTRAST:  147mGADAVIST GADOBUTROL 1 MMOL/ML IV SOLN COMPARISON:  Head CT without contrast 01/27/2020. FINDINGS: Brain: No abnormal enhancement  identified. No midline shift, mass effect, or evidence of intracranial mass lesion. No dural thickening. No restricted diffusion to suggest acute infarction. No ventriculomegaly, extra-axial collection or acute intracranial hemorrhage. Cervicomedullary junction and pituitary are within normal limits. Patchy and confluent bilateral cerebral white matter T2 and FLAIR hyperintensity with superimposed chronic lacunar infarcts of the bilateral corona radiata and basal ganglia. Occasional chronic micro hemorrhages (right parietal lobe series 9, image 24). No cortical encephalomalacia identified. Vascular: Major intracranial vascular flow voids are preserved. Skull and upper cervical spine: Diffusely decreased T1 marrow signal throughout the skull and visible cervical spine. No destructive osseous lesion is identified. Negative visible spinal cord. Sinuses/Orbits: Negative orbits. Paranasal sinuses and mastoids are stable and well pneumatized. Other: Visible internal auditory structures appear normal. Scalp and face soft tissues appear negative. IMPRESSION: 1. No metastatic disease to the brain or acute intracranial abnormality. Moderate chronic small vessel disease in the brain. 2. Diffusely decreased bone marrow signal with no destructive osseous lesion identified. Perhaps this is red marrow reactivation such as from anemia, but osseous metastatic disease is difficult to exclude. Electronically Signed   By: H Genevie Ann.D.   On: 01/30/2020 18:38   DG Chest [facility] View  Result  Date: 01/30/2020 CLINICAL DATA:  Right-sided thoracentesis. EXAM: PORTABLE CHEST 1 VIEW COMPARISON:  01/29/2020.  CT 01/24/2020. FINDINGS: Mediastinum stable. Stable cardiomegaly. Atelectasis/infiltrate right lung base. Moderate right pleural effusion. Similar findings noted on prior exam. No evidence of pneumothorax post thoracentesis. Degenerative change thoracic spine. Left fourth rib expansile lesion again noted. IMPRESSION: 1. No evidence of  pneumothorax post thoracentesis. Residual right pleural effusion noted. 2.  Right base atelectasis/infiltrate again noted. 3.  Stable cardiomegaly. 4. Left fourth rib expansile lesion as previously noted on prior CT of 01/24/2020. Electronically Signed   By: Marcello Moores  Register   On: 01/30/2020 12:02   DG CHEST PORT 1 VIEW  Result Date: 01/29/2020 CLINICAL DATA:  74 year old male status post bronchoscopy today for right lower lobe mass. Bronchoscopic biopsy. EXAM: PORTABLE CHEST 1 VIEW COMPARISON:  Portable chest 01/27/2020, CT 01/24/2020. FINDINGS: Portable AP semi upright view at 1502 hours. Stable lung volumes and mediastinal contours. No pneumothorax. Continued dense right mid and lower lung opacification. Allowing for portable technique the left lung remains clear. Stable visualized osseous structures. IMPRESSION: No adverse features status post bronchoscopy. Stable opacification of the right lower lung. Electronically Signed   By: Genevie Ann M.D.   On: 01/29/2020 15:11   DG CHEST PORT 1 VIEW  Result Date: 01/27/2020 CLINICAL DATA:  74 year old male status post thoracentesis on the right side. EXAM: PORTABLE CHEST 1 VIEW COMPARISON:  Chest x-ray 01/25/2020. FINDINGS: Persistent moderate to large right pleural effusion with atelectasis and/or consolidation in the right lung base. Left lung is clear. No left pleural effusion. No pneumothorax. No evidence of pulmonary edema. Heart size appears borderline enlarged. Upper mediastinal contours are distorted by patient's rotation to the right. IMPRESSION: 1. Persistent moderate to large right pleural effusion with atelectasis and/or consolidation in the right lung base. 2. No pneumothorax. Electronically Signed   By: Vinnie Langton M.D.   On: 01/27/2020 10:50   US THORACENTESIS ASP PLEURAL SPACE W/IMG GUIDE  Result Date: 01/30/2020 INDICATION: Patient with a history of right lung cancer and recurrent right pleural effusions. Interventional radiology asked to  perform a therapeutic thoracentesis. EXAM: ULTRASOUND GUIDED THORACENTESIS MEDICATIONS: 1% lidocaine 10 mL COMPLICATIONS: None immediate. PROCEDURE: An ultrasound guided thoracentesis was thoroughly discussed with the patient and questions answered. The benefits, risks, alternatives and complications were also discussed. The patient understands and wishes to proceed with the procedure. Written consent was obtained. Ultrasound was performed to localize and mark an adequate pocket of fluid in the right chest. The area was then prepped and draped in the normal sterile fashion. 1% Lidocaine was used for local anesthesia. Under ultrasound guidance a 6 Fr Safe-T-Centesis catheter was introduced. Thoracentesis was performed. The catheter was removed and a dressing applied. FINDINGS: A total of approximately 850 mL of straw-colored fluid was removed. IMPRESSION: Successful ultrasound guided right thoracentesis yielding 850 mL of pleural fluid. Read by: Soyla Dryer, NP Electronically Signed   By: Jacqulynn Cadet M.D.   On: 01/30/2020 12:15   US THORACENTESIS ASP PLEURAL SPACE W/IMG GUIDE  Result Date: 01/25/2020 INDICATION: Patient with history of right infrahilar hilar mass seen on CT a chest 01/24/2020, cough, dyspnea, and right pleural effusion. Request made for diagnostic and therapeutic right thoracentesis. EXAM: ULTRASOUND GUIDED DIAGNOSTIC AND THERAPEUTIC RIGHT THORACENTESIS MEDICATIONS: 15 mL 1% lidocaine COMPLICATIONS: None immediate. PROCEDURE: An ultrasound guided thoracentesis was thoroughly discussed with the patient and questions answered. The benefits, risks, alternatives and complications were also discussed. The patient understands and wishes to proceed with  the procedure. Written consent was obtained. Ultrasound was performed to localize and mark an adequate pocket of fluid in the right chest. The area was then prepped and draped in the normal sterile fashion. 1% Lidocaine was used for local  anesthesia. Under ultrasound guidance a 19 gauge, 7-cm, Yueh catheter was introduced by Dr. Pascal Lux. Thoracentesis was performed. The catheter was removed and a dressing applied. FINDINGS: A total of approximately 1.2 L of hazy amber fluid was removed. Samples were sent to the laboratory as requested by the clinical team. IMPRESSION: Successful ultrasound guided right thoracentesis yielding 1.2 L of pleural fluid. Read by: Earley Abide, PA-C Electronically Signed   By: Sandi Mariscal M.D.   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   PLAN: I had a lengthy discussion with the patient today about his current disease stage, prognosis and treatment options. I personally and independently reviewed the scan images and discussed the result and showed the images to the patient today. I explained to the patient that he has incurable condition and all the treatment will be of palliative nature. I recommended for the patient to keep the appointment for the PET scan next week. I also requested the tissue block to be sent for PD-L1 expression. I discussed with the patient his treatment options and I gave him the option of palliative care and hospice referral versus palliative systemic chemotherapy with either four cycles of carboplatin and paclitaxel with Keytruda followed by maintenance Keytruda versus systemic chemotherapy with two cycles of carboplatin and paclitaxel with immunotherapy in the form of ipilimumab 1 mg/KG every 6 weeks in addition to nivolumab 360 mg IV every 3 weeks followed by maintenance treatment with  immunotherapy for up to 2 years if he has no evidence for disease progression or unacceptable toxicity. I discussed with the patient the adverse effect of this treatment including but not limited to alopecia, myelosuppression, nausea and vomiting, peripheral neuropathy, liver or renal dysfunction in addition to the immunotherapy adverse effects. The patient is interested in proceeding with a short course of chemotherapy in addition to ipilimumab and nivolumab. He is expected to start the first cycle of this treatment on 02/20/2020 after completion of the palliative radiotherapy. I will arrange for the patient to have a chemotherapy education class before the first dose of his treatment. I will call his pharmacy with prescription for Compazine 10 mg p.o. every 6 hours as needed for nausea. For the cough I will give him refill of Tussionex. For the swelling of the lower extremities, I will send prescription for Lasix 20 mg p.o. daily as needed for the swelling. For the hypothyroidism, he will continue his current treatment with levothyroxine and will continue to monitor his TSH closely during the treatment with immunotherapy. The patient will come back for follow-up visit with the start of the first cycle of his treatment. He was advised to call immediately if he has any other concerning symptoms in the interval.  The patient voices understanding of current disease status and treatment options and is in agreement with the current care plan.  All questions were answered. The patient knows to call the clinic  with any problems, questions or concerns. We can certainly see the patient much sooner if necessary.  Thank you so much for allowing me to participate in the care of [facility]. I will continue to follow up the patient with you and assist in his care.  The total time spent in the appointment was 90 minutes.  Disclaimer: This note was dictated with voice recognition software. Similar sounding  words can inadvertently be transcribed and may not be corrected upon review.   Jack Thompson February 09, 2020, 9:04 AM

## 2020-02-09 NOTE — Progress Notes (Signed)
[facility] CSW Progress Notes  Submitted [employer] [place] transportation referral for patient - he can use this service for appointments outside of [facility] if needed.  Per patient, [facility] transportation is working well at this time for [facility] appointments.  Edwyna Shell, LCSW Clinical Social Worker Phone:  [phone]

## 2020-02-10 ENCOUNTER — Encounter: Payer: Self-pay | Admitting: Internal Medicine

## 2020-02-10 DIAGNOSIS — Z5112 Encounter for antineoplastic immunotherapy: Secondary | ICD-10-CM | POA: Insufficient documentation

## 2020-02-10 DIAGNOSIS — Z5111 Encounter for antineoplastic chemotherapy: Secondary | ICD-10-CM | POA: Insufficient documentation

## 2020-02-10 DIAGNOSIS — Z7189 Other specified counseling: Secondary | ICD-10-CM | POA: Insufficient documentation

## 2020-02-10 MED ORDER — FUROSEMIDE 20 MG PO TABS
ORAL_TABLET | ORAL | 0 refills | Status: AC
Start: 2020-02-10 — End: ?

## 2020-02-11 DIAGNOSIS — R69 Illness, unspecified: Secondary | ICD-10-CM | POA: Diagnosis not present

## 2020-02-12 ENCOUNTER — Telehealth: Payer: Self-pay | Admitting: Medical Oncology

## 2020-02-12 ENCOUNTER — Ambulatory Visit
Admission: RE | Admit: 2020-02-12 | Discharge: 2020-02-12 | Disposition: A | Discharge status: Home / Self Care | Payer: Medicare HMO | Source: Ambulatory Visit | Attending: Radiation Oncology | Admitting: Radiation Oncology

## 2020-02-12 ENCOUNTER — Other Ambulatory Visit: Payer: Self-pay

## 2020-02-12 DIAGNOSIS — C3401 Malignant neoplasm of right main bronchus: Secondary | ICD-10-CM | POA: Diagnosis not present

## 2020-02-12 DIAGNOSIS — J910 Malignant pleural effusion: Secondary | ICD-10-CM | POA: Diagnosis not present

## 2020-02-12 DIAGNOSIS — C3431 Malignant neoplasm of lower lobe, right bronchus or lung: Secondary | ICD-10-CM | POA: Diagnosis not present

## 2020-02-12 DIAGNOSIS — Z510 Encounter for antineoplastic radiation therapy: Secondary | ICD-10-CM | POA: Diagnosis not present

## 2020-02-12 DIAGNOSIS — R69 Illness, unspecified: Secondary | ICD-10-CM | POA: Diagnosis not present

## 2020-02-12 NOTE — Telephone Encounter (Signed)
Faxed order for portable oxygen tank.

## 2020-02-13 ENCOUNTER — Other Ambulatory Visit: Payer: Self-pay

## 2020-02-13 ENCOUNTER — Inpatient Hospital Stay: Payer: Medicare HMO

## 2020-02-13 ENCOUNTER — Telehealth: Payer: Self-pay | Admitting: *Deleted

## 2020-02-13 ENCOUNTER — Ambulatory Visit
Admission: RE | Admit: 2020-02-13 | Discharge: 2020-02-13 | Disposition: A | Discharge status: Home / Self Care | Payer: Medicare HMO | Source: Ambulatory Visit | Attending: Radiation Oncology | Admitting: Radiation Oncology

## 2020-02-13 DIAGNOSIS — Z510 Encounter for antineoplastic radiation therapy: Secondary | ICD-10-CM | POA: Diagnosis not present

## 2020-02-13 DIAGNOSIS — J910 Malignant pleural effusion: Secondary | ICD-10-CM | POA: Diagnosis not present

## 2020-02-13 DIAGNOSIS — R69 Illness, unspecified: Secondary | ICD-10-CM | POA: Diagnosis not present

## 2020-02-13 DIAGNOSIS — C3401 Malignant neoplasm of right main bronchus: Secondary | ICD-10-CM | POA: Diagnosis not present

## 2020-02-13 DIAGNOSIS — C3431 Malignant neoplasm of lower lobe, right bronchus or lung: Secondary | ICD-10-CM | POA: Diagnosis not present

## 2020-02-14 ENCOUNTER — Ambulatory Visit (HOSPITAL_COMMUNITY)
Admission: RE | Admit: 2020-02-14 | Discharge: 2020-02-14 | Disposition: A | Discharge status: Home / Self Care | Payer: Medicare HMO | Source: Ambulatory Visit | Attending: Radiation Oncology | Admitting: Radiation Oncology

## 2020-02-14 ENCOUNTER — Ambulatory Visit
Admission: RE | Admit: 2020-02-14 | Discharge: 2020-02-14 | Disposition: A | Discharge status: Home / Self Care | Payer: Medicare HMO | Source: Ambulatory Visit | Attending: Radiation Oncology | Admitting: Radiation Oncology

## 2020-02-14 ENCOUNTER — Other Ambulatory Visit: Payer: Self-pay

## 2020-02-14 DIAGNOSIS — K429 Umbilical hernia without obstruction or gangrene: Secondary | ICD-10-CM | POA: Diagnosis not present

## 2020-02-14 DIAGNOSIS — C787 Secondary malignant neoplasm of liver and intrahepatic bile duct: Secondary | ICD-10-CM | POA: Insufficient documentation

## 2020-02-14 DIAGNOSIS — I2510 Atherosclerotic heart disease of native coronary artery without angina pectoris: Secondary | ICD-10-CM | POA: Diagnosis not present

## 2020-02-14 DIAGNOSIS — I77811 Abdominal aortic ectasia: Secondary | ICD-10-CM | POA: Diagnosis not present

## 2020-02-14 DIAGNOSIS — C3401 Malignant neoplasm of right main bronchus: Secondary | ICD-10-CM | POA: Diagnosis not present

## 2020-02-14 DIAGNOSIS — R69 Illness, unspecified: Secondary | ICD-10-CM | POA: Diagnosis not present

## 2020-02-14 DIAGNOSIS — I700 Atherosclerosis of aorta: Secondary | ICD-10-CM | POA: Diagnosis not present

## 2020-02-14 DIAGNOSIS — K5730 Diverticulosis of large intestine without perforation or abscess without bleeding: Secondary | ICD-10-CM | POA: Insufficient documentation

## 2020-02-14 DIAGNOSIS — C3431 Malignant neoplasm of lower lobe, right bronchus or lung: Secondary | ICD-10-CM | POA: Diagnosis not present

## 2020-02-14 DIAGNOSIS — Z510 Encounter for antineoplastic radiation therapy: Secondary | ICD-10-CM | POA: Diagnosis not present

## 2020-02-14 DIAGNOSIS — J910 Malignant pleural effusion: Secondary | ICD-10-CM | POA: Diagnosis not present

## 2020-02-14 DIAGNOSIS — C7951 Secondary malignant neoplasm of bone: Secondary | ICD-10-CM | POA: Diagnosis not present

## 2020-02-14 DIAGNOSIS — C3490 Malignant neoplasm of unspecified part of unspecified bronchus or lung: Secondary | ICD-10-CM | POA: Insufficient documentation

## 2020-02-14 LAB — GLUCOSE, CAPILLARY: Glucose-Capillary: 123 mg/dL — ABNORMAL HIGH (ref 70–99)

## 2020-02-14 MED ORDER — FLUDEOXYGLUCOSE F - 18 (FDG) INJECTION
12.3000 | Freq: Once | INTRAVENOUS | Status: AC | PRN
  Administered 2020-02-14: 12.3 via INTRAVENOUS

## 2020-02-15 ENCOUNTER — Telehealth: Payer: Self-pay | Admitting: Medical Oncology

## 2020-02-15 ENCOUNTER — Ambulatory Visit: Payer: Medicare HMO

## 2020-02-15 ENCOUNTER — Other Ambulatory Visit: Payer: Self-pay | Admitting: Medical Oncology

## 2020-02-15 ENCOUNTER — Other Ambulatory Visit: Payer: Self-pay | Admitting: Internal Medicine

## 2020-02-15 DIAGNOSIS — Z5111 Encounter for antineoplastic chemotherapy: Secondary | ICD-10-CM

## 2020-02-15 DIAGNOSIS — C3491 Malignant neoplasm of unspecified part of right bronchus or lung: Secondary | ICD-10-CM

## 2020-02-15 MED ORDER — HYDROCOD POLST-CPM POLST ER 10-8 MG/5ML PO SUER: 5.0000 mL | Freq: Two times a day (BID) | ORAL | 0 refills | Status: AC | PRN

## 2020-02-15 NOTE — Telephone Encounter (Signed)
[facility] [phone], did not have enough tussionex to fill rx. 02/09/20 . I called [facility] to cancel rx.    [facility] can fill tussionex 120 ml Son requests hydrocodone rx go to [facility] so it can be picked up tonight before 7.

## 2020-02-16 ENCOUNTER — Ambulatory Visit: Payer: Medicare HMO

## 2020-02-18 DIAGNOSIS — R4020 Unspecified coma: Secondary | ICD-10-CM | POA: Diagnosis not present

## 2020-02-19 ENCOUNTER — Telehealth: Payer: Self-pay | Admitting: Medical Oncology

## 2020-02-19 ENCOUNTER — Ambulatory Visit: Payer: Medicare HMO

## 2020-02-19 ENCOUNTER — Inpatient Hospital Stay: Payer: Medicare HMO

## 2020-02-19 NOTE — Telephone Encounter (Signed)
Pt died Sunday august 15th , 2021.

## 2020-02-20 ENCOUNTER — Inpatient Hospital Stay: Payer: Medicare HMO

## 2020-02-20 ENCOUNTER — Telehealth: Payer: Self-pay | Admitting: *Deleted

## 2020-02-20 ENCOUNTER — Inpatient Hospital Stay: Payer: Medicare HMO | Admitting: Nutrition

## 2020-02-20 ENCOUNTER — Ambulatory Visit: Payer: Medicare HMO

## 2020-02-20 NOTE — Telephone Encounter (Signed)
I received a message that patient's son would like a call from [place].  I called but was unable to reach. I did leave vm message to call if needed.

## 2020-02-21 ENCOUNTER — Telehealth: Payer: Self-pay | Admitting: *Deleted

## 2020-02-21 ENCOUNTER — Ambulatory Visit: Payer: Medicare HMO

## 2020-02-21 NOTE — Telephone Encounter (Signed)
I received a message from CSW to reach out to patient's son.  I called and spoke with him today.  He did not have any questions and is just trying to deal with the passing of his dad.  I offered my condolences.

## 2020-02-22 ENCOUNTER — Inpatient Hospital Stay: Payer: Medicare HMO

## 2020-02-22 ENCOUNTER — Ambulatory Visit: Payer: Medicare HMO

## 2020-02-23 ENCOUNTER — Ambulatory Visit: Payer: Medicare HMO

## 2020-02-26 ENCOUNTER — Ambulatory Visit: Payer: Medicare HMO

## 2020-02-27 ENCOUNTER — Inpatient Hospital Stay: Payer: Medicare HMO

## 2020-02-27 ENCOUNTER — Inpatient Hospital Stay: Payer: Medicare HMO | Admitting: Internal Medicine

## 2020-02-27 ENCOUNTER — Ambulatory Visit: Payer: Medicare HMO

## 2020-02-28 ENCOUNTER — Ambulatory Visit: Payer: Medicare HMO

## 2020-02-29 ENCOUNTER — Ambulatory Visit: Payer: Medicare HMO

## 2020-03-01 ENCOUNTER — Ambulatory Visit: Payer: Medicare HMO

## 2020-03-04 ENCOUNTER — Ambulatory Visit: Payer: Medicare HMO

## 2020-03-05 ENCOUNTER — Ambulatory Visit: Payer: Medicare HMO

## 2020-03-06 ENCOUNTER — Ambulatory Visit: Payer: Medicare HMO

## 2020-03-06 DIAGNOSIS — 419620001 Death: Secondary | SNOMED CT | POA: Diagnosis not present

## 2020-03-06 DEATH — deceased

## 2020-03-07 ENCOUNTER — Ambulatory Visit: Payer: Medicare HMO

## 2020-03-08 ENCOUNTER — Ambulatory Visit: Payer: Medicare HMO

## 2020-03-12 ENCOUNTER — Other Ambulatory Visit: Payer: Medicare HMO

## 2020-03-12 ENCOUNTER — Ambulatory Visit: Payer: Medicare HMO

## 2020-03-13 ENCOUNTER — Ambulatory Visit: Payer: Medicare HMO

## 2020-03-14 ENCOUNTER — Ambulatory Visit: Payer: Medicare HMO

## 2020-03-15 ENCOUNTER — Ambulatory Visit: Payer: Medicare HMO

## 2020-03-20 ENCOUNTER — Ambulatory Visit: Payer: Self-pay | Admitting: Urology

## 2021-12-16 IMAGING — CR DG CHEST 2V
2 series · 2 of 2 positions shown · non-contrast
Comparison: None

CLINICAL DATA: Short of breath.

EXAM:
CHEST - 2 VIEW

[w chest pa]
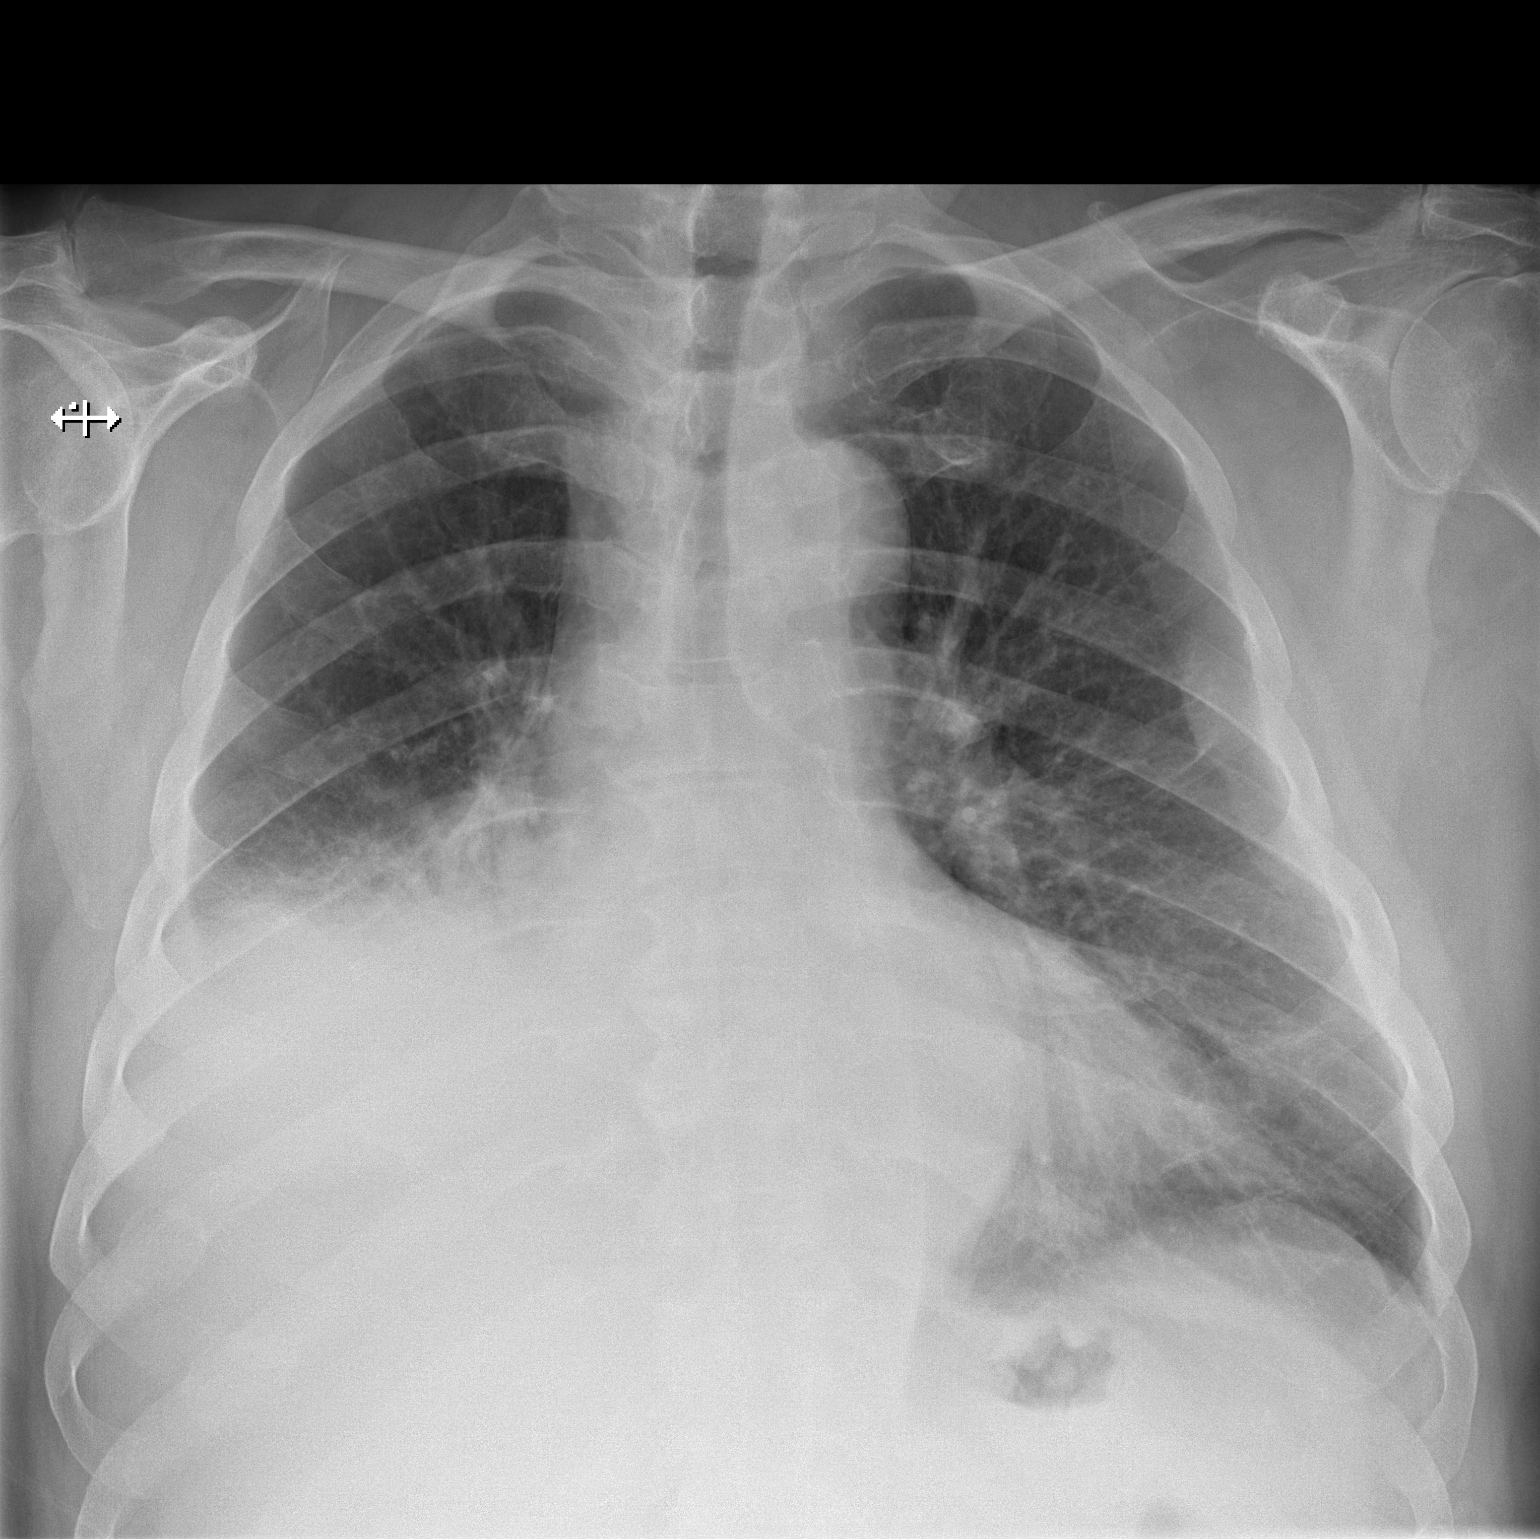

[w chest lat]
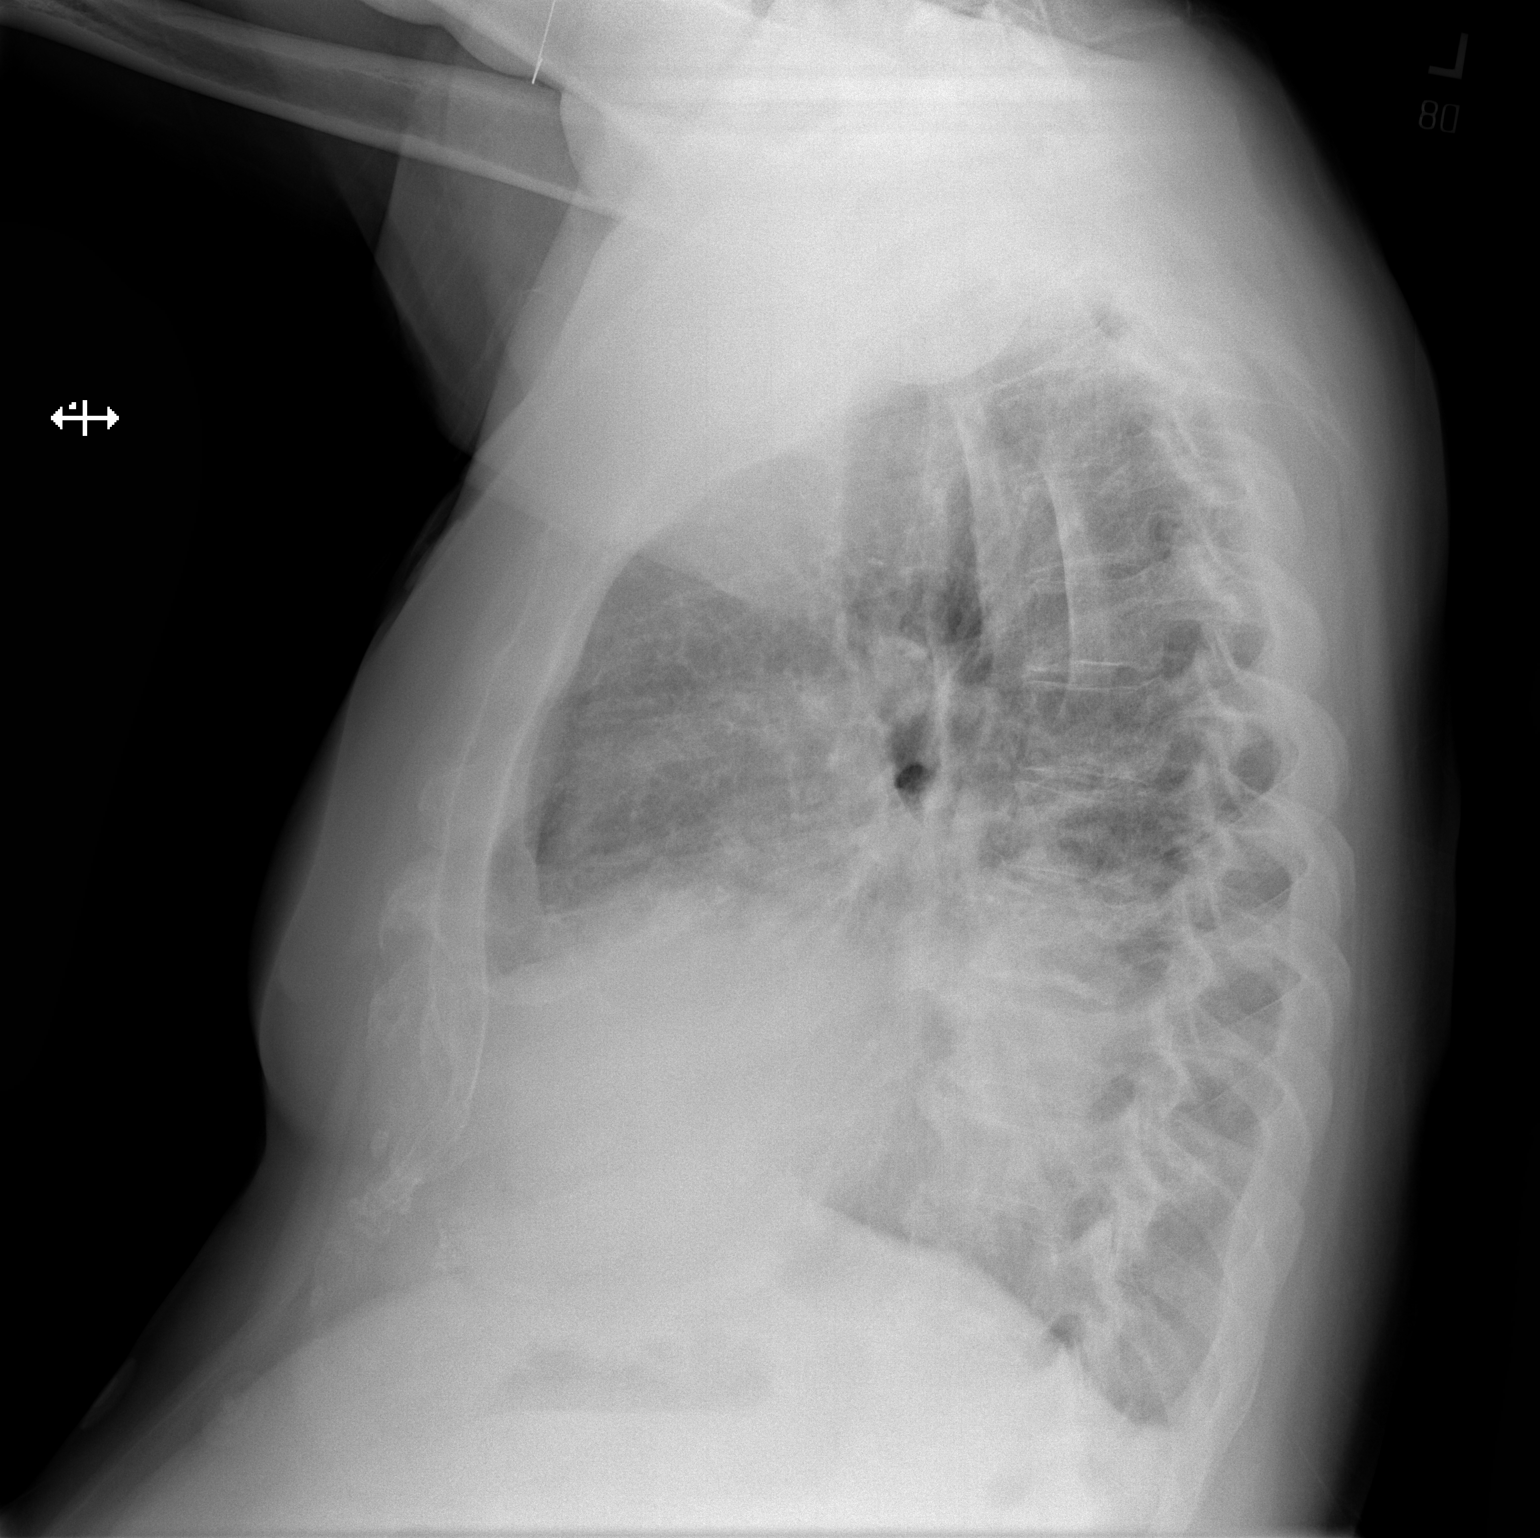

[2 of 2 positions shown; findings below may reference images not displayed]

FINDINGS: Mild cardiac enlargement. Small left pleural effusion. Significantly
diminished aeration to the right lower lobe is favored to represent
a combination of pleural effusion, atelectasis and airspace
consolidation. No airspace opacities identified within the left
lung. Remote right lower posterior rib deformities.
IMPRESSION: Diminished aeration to the right lower lobe likely due to pleural
effusion, atelectasis and airspace consolidation. Underlying mass
lesion would be difficult to exclude. Consider further evaluation
with CT of the chest with contrast material.

## 2021-12-17 IMAGING — DX DG CHEST 1V
1 series · 1 of 1 positions shown · non-contrast
Comparison: Ultrasound 01/25/2020. Chest x-ray 01/24/2020. Chest CT
01/24/2020.

CLINICAL DATA: Status post right-sided thoracentesis.

EXAM:
CHEST  1 VIEW

[chest pa]
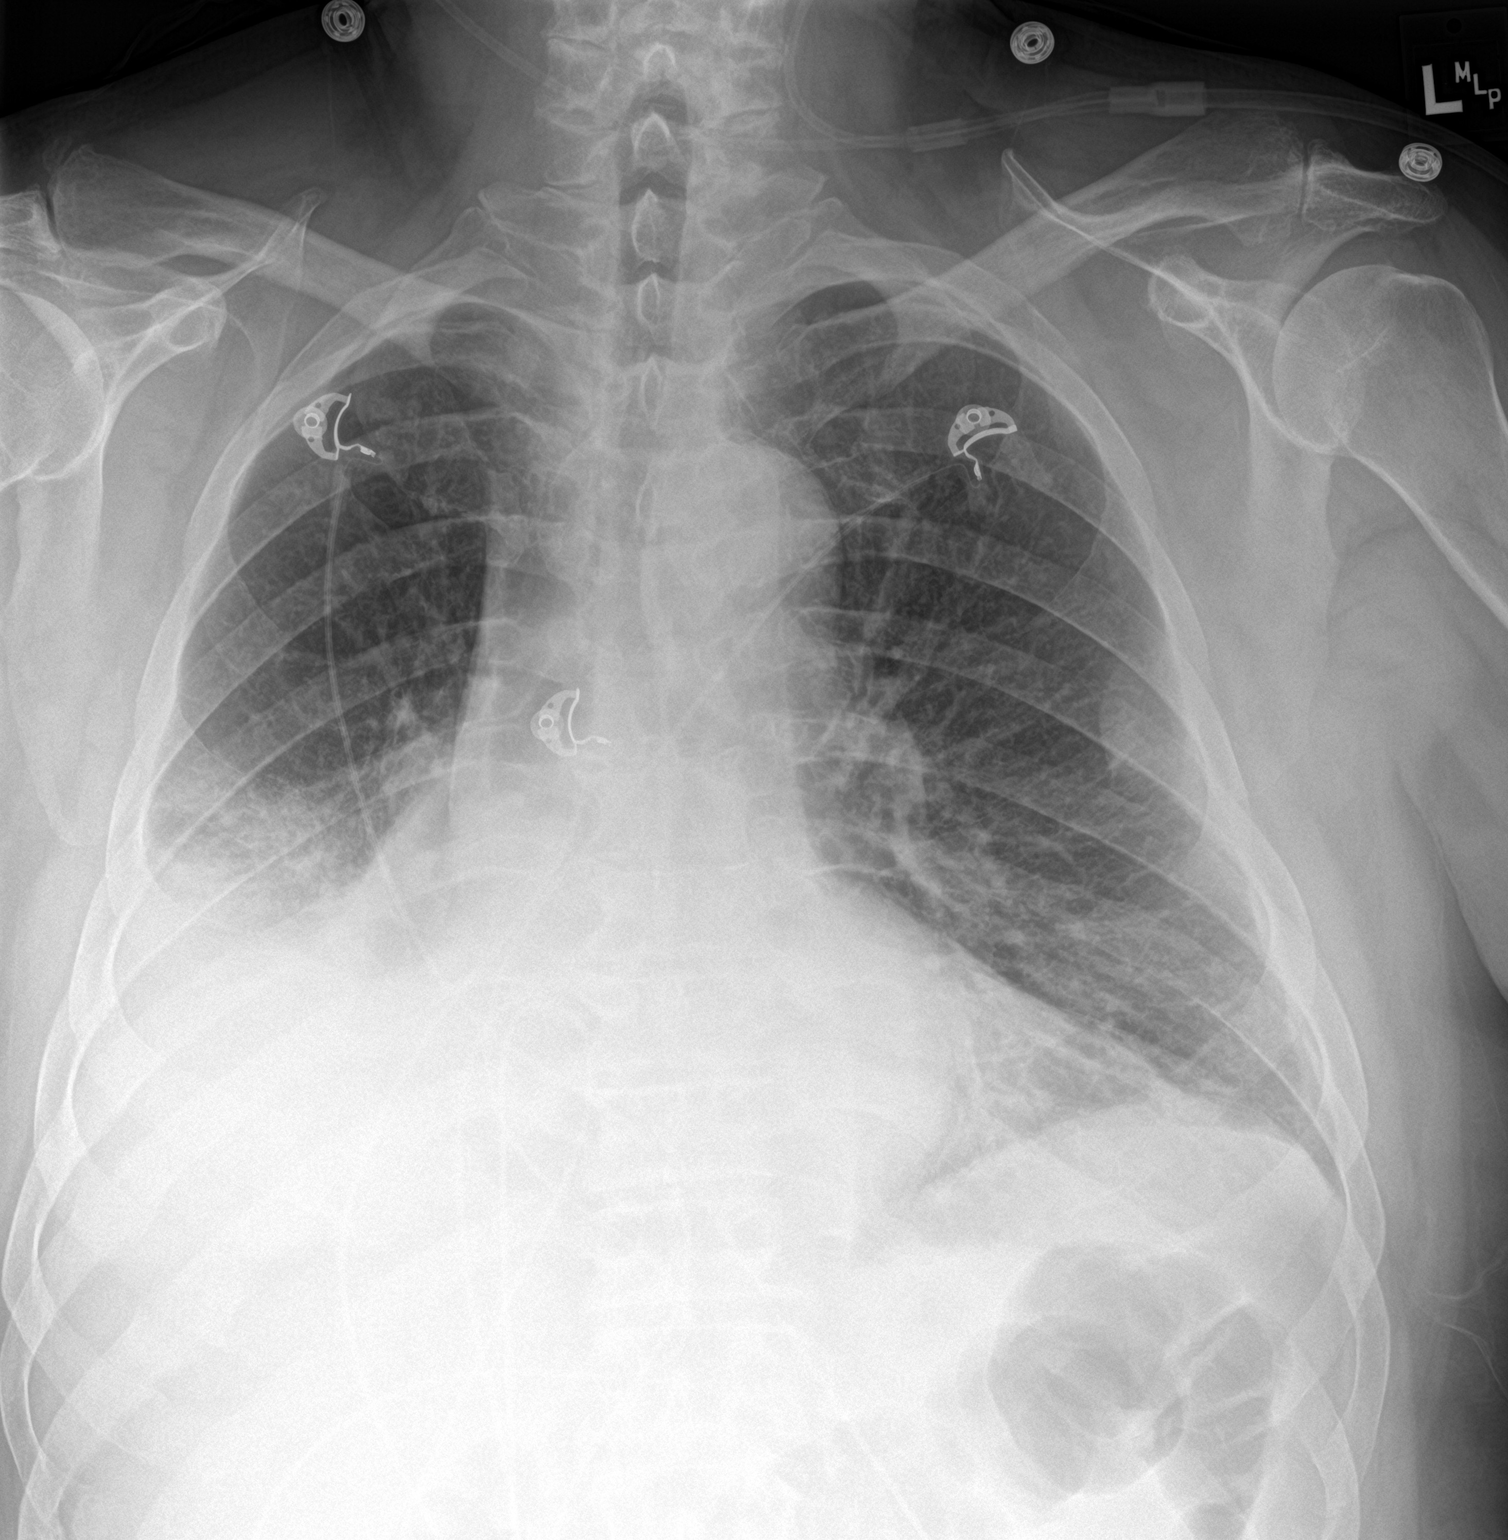

[1 of 1 positions shown; findings below may reference images not displayed]

FINDINGS: Mediastinum is stable. Persistent right lower lobe
atelectasis/consolidation. Reference is made to recent CT report for
discussion of underlying mass. Small right pleural effusion remains
after pneumothorax. No pneumothorax. Persistent left base
atelectasis. Reference is made to CT report for discussion of left
rib lesion.
IMPRESSION: 1. No evidence of pneumothorax post thoracentesis. Small residual
right pleural effusion. No pneumothorax

2. Persistent right lower lobe atelectasis/consolidation. Reference
is made to recent CT report for discussion of underlying mass.
Reference is made to CT report for discussion of left rib lesion.

2.  Persistent left base atelectasis.

## 2021-12-17 IMAGING — US US THORACENTESIS ASP PLEURAL SPACE W/IMG GUIDE
1 series · 6 of 6 positions shown · non-contrast
Comparison: none

INDICATION: Patient with history of right infrahilar hilar mass seen on CT a
chest 01/24/2020, cough, dyspnea, and right pleural effusion.
Request made for diagnostic and therapeutic right thoracentesis.

[Series 1: us thoracentesis asp pleural space w/img guide · 6 of 6 slices shown]
[im 1/6]
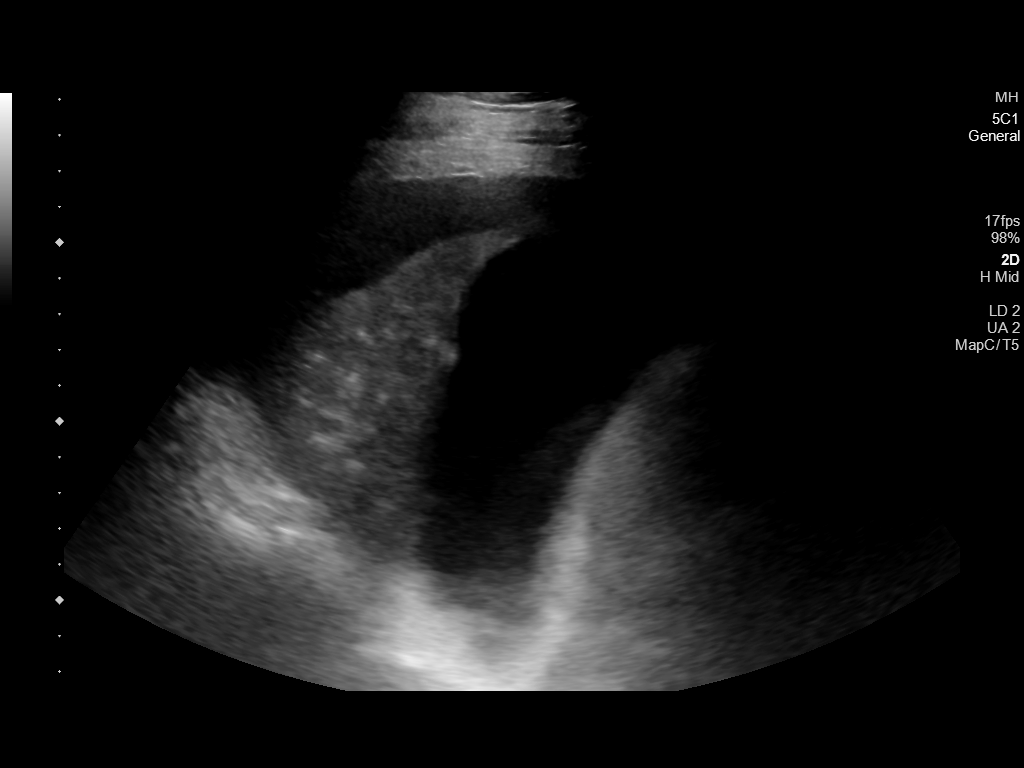
[im 2/6]
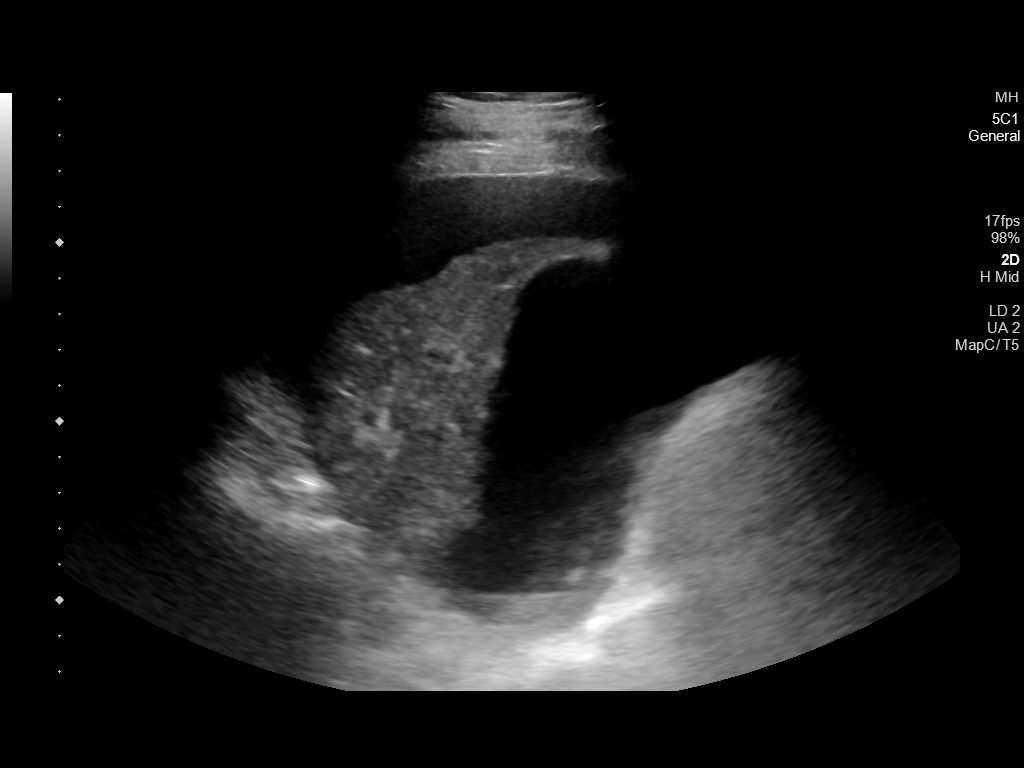
[im 3/6]
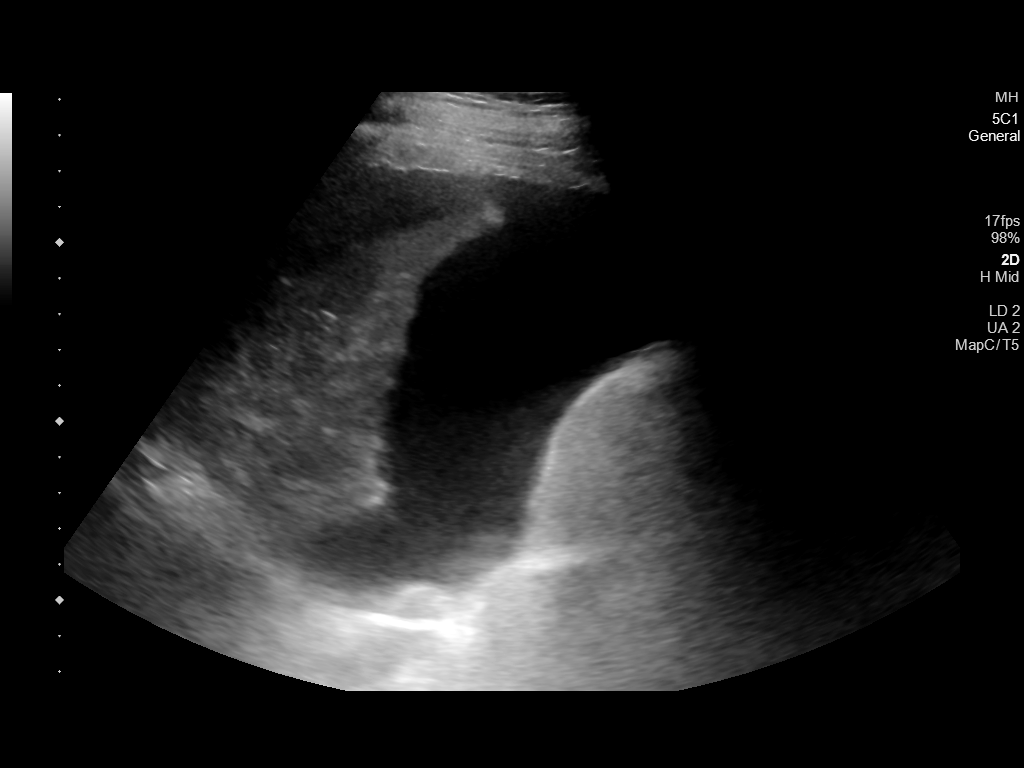
[im 4/6]
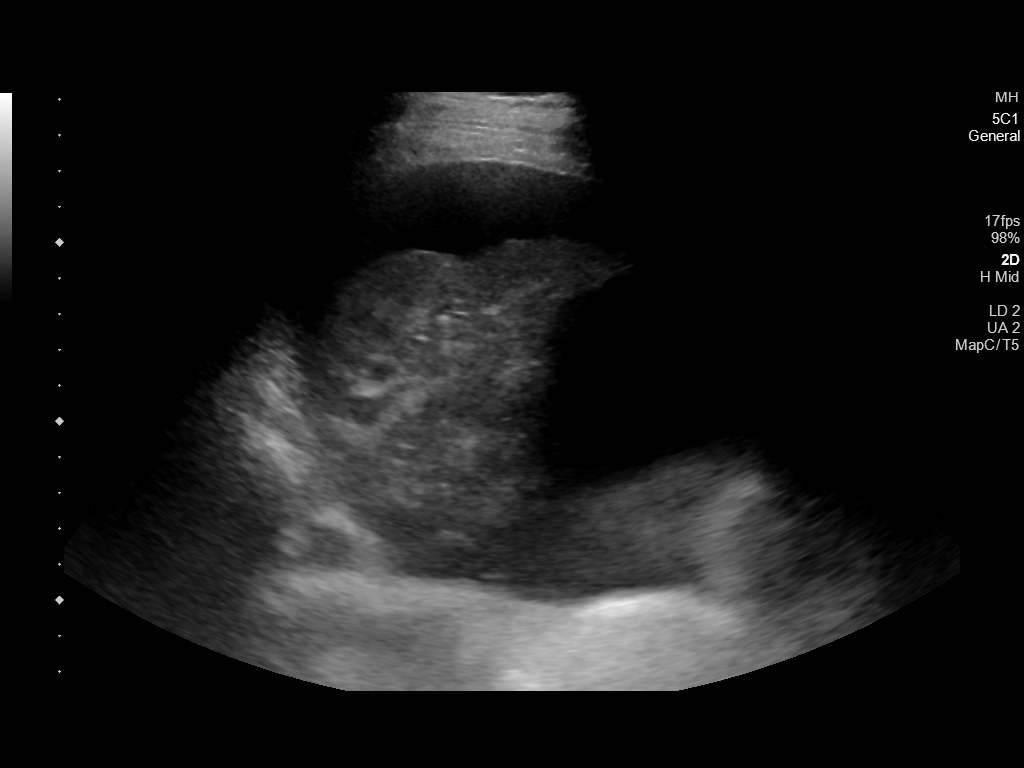
[im 5/6]
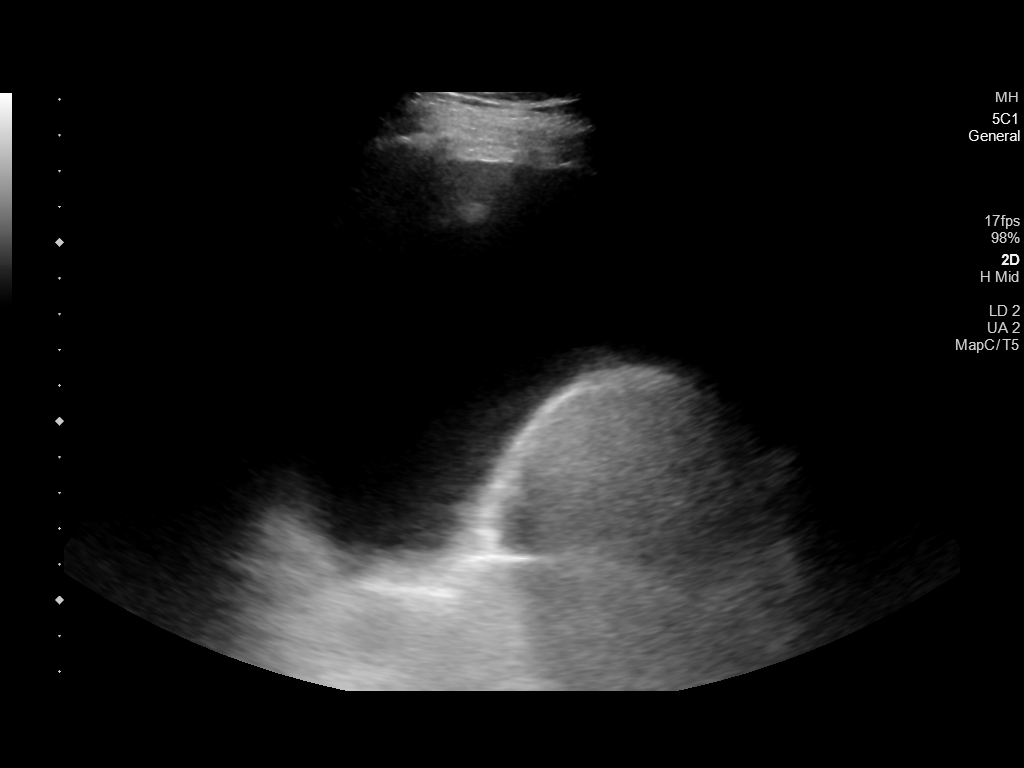
[im 6/6]
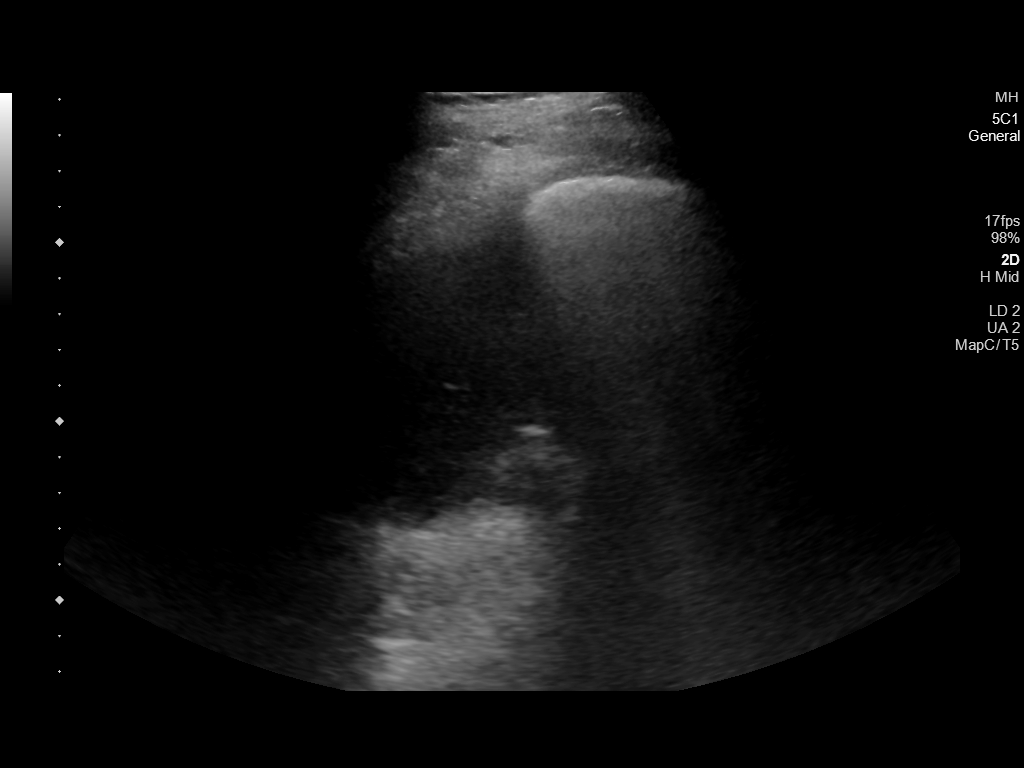

[6 of 6 positions shown; findings below may reference images not displayed]

EXAM:
ULTRASOUND GUIDED DIAGNOSTIC AND THERAPEUTIC RIGHT THORACENTESIS

MEDICATIONS:
15 mL 1% lidocaine

COMPLICATIONS:
None immediate.

PROCEDURE:
An ultrasound guided thoracentesis was thoroughly discussed with the
patient and questions answered. The benefits, risks, alternatives
and complications were also discussed. The patient understands and
wishes to proceed with the procedure. Written consent was obtained.

Ultrasound was performed to localize and mark an adequate pocket of
fluid in the right chest. The area was then prepped and draped in
the normal sterile fashion. 1% Lidocaine was used for local
anesthesia. Under ultrasound guidance a 19 gauge, 7-cm, Yueh
catheter was introduced by Dr. Ogasawara. Thoracentesis was performed.
The catheter was removed and a dressing applied.
FINDINGS: A total of approximately 1.2 L of hazy amber fluid was removed.
Samples were sent to the laboratory as requested by the clinical
team.
IMPRESSION: Successful ultrasound guided right thoracentesis yielding 1.2 L of
pleural fluid.

## 2021-12-19 IMAGING — CT CT HEAD W/O CM
3 series · 15 of 47 positions shown, 18 images · non-contrast
Comparison: 03/16/2011

CLINICAL DATA: Delirium.  Lung mass.

EXAM:
CT HEAD WITHOUT CONTRAST
TECHNIQUE: Contiguous axial images were obtained from the base of the skull
through the vertex without intravenous contrast.

[Series 2: head wo · axial · 0.47mm/px · z∈[-132,+8]mm · 9 of 34 slices shown, 12 images]
[im 3/34  brain]
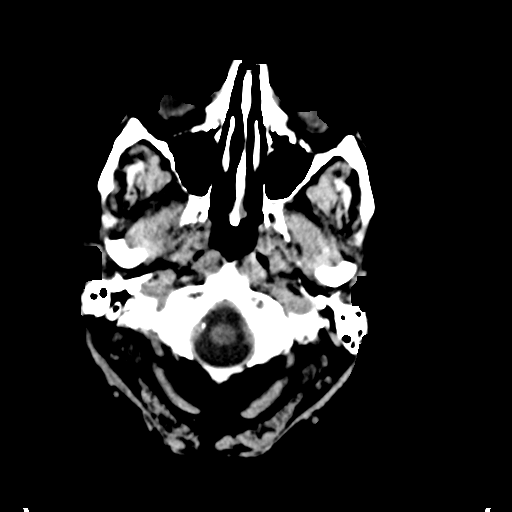
[im 3/34  bone]
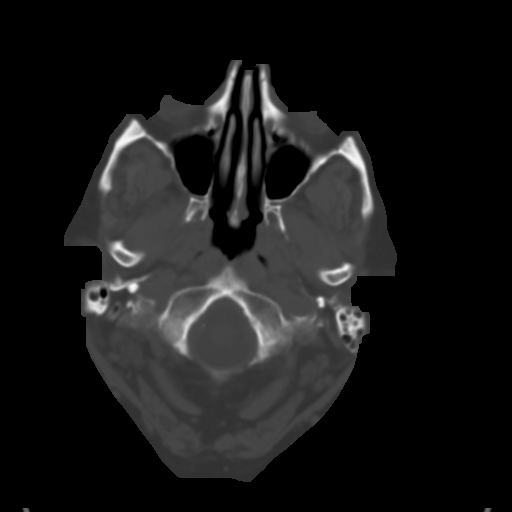
[im 6/34  brain]
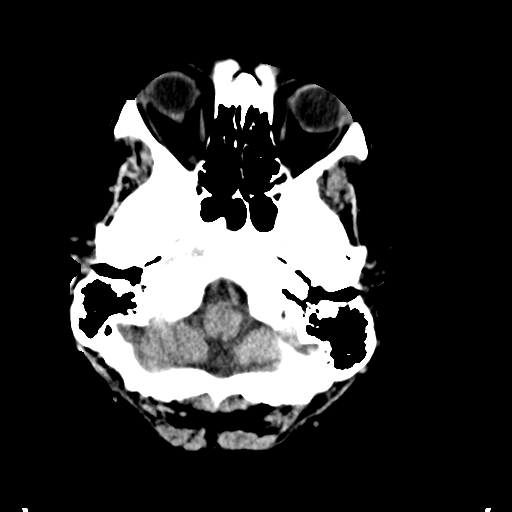
[im 10/34  brain]
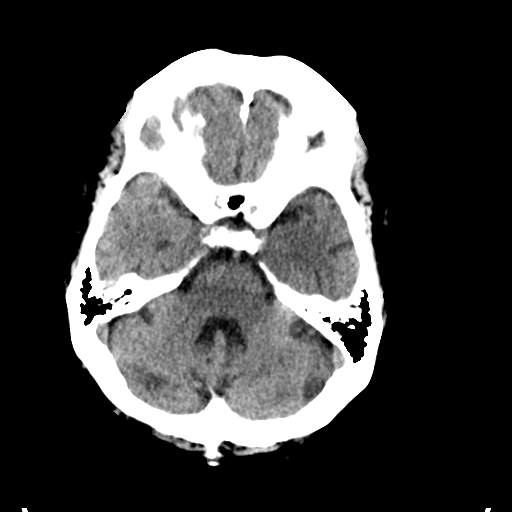
[im 13/34  brain]
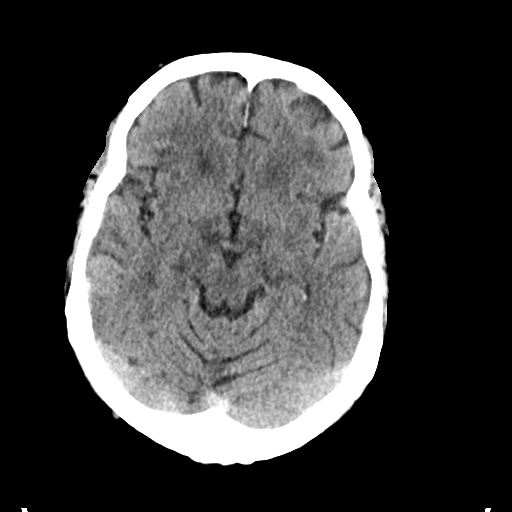
[im 18/34  brain]
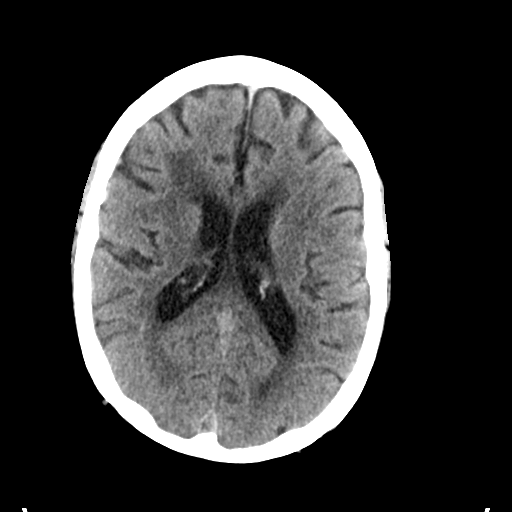
[im 18/34  bone]
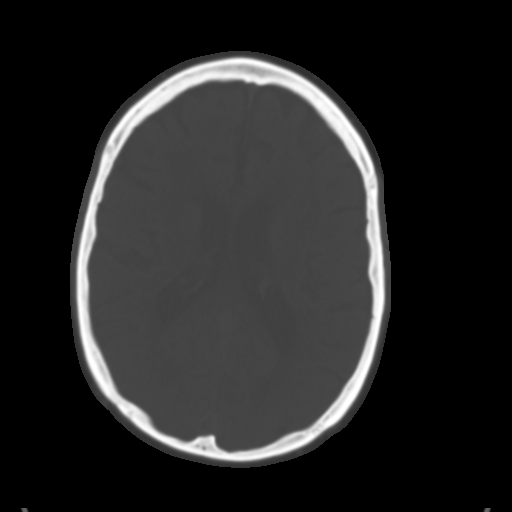
[im 21/34  brain]
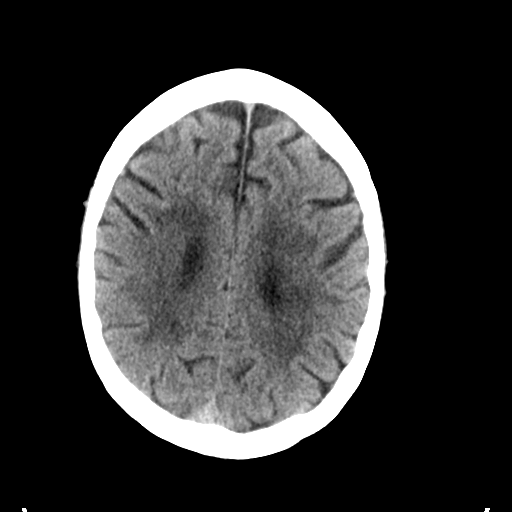
[im 24/34  brain]
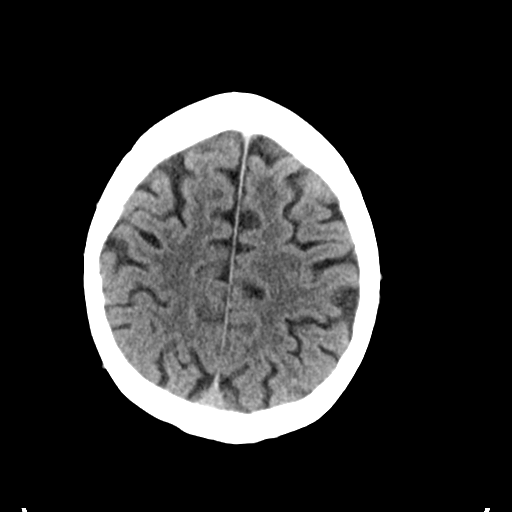
[im 28/34  brain]
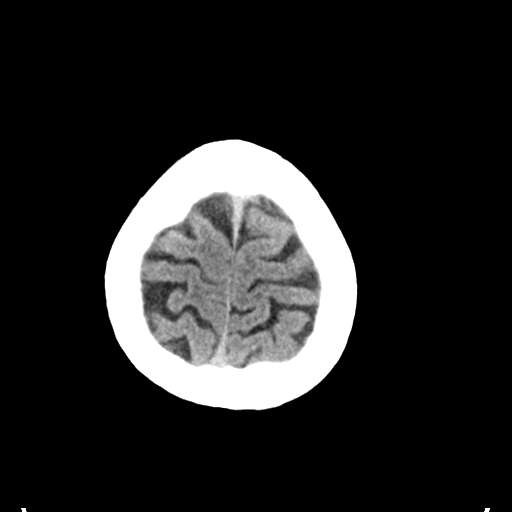
[im 31/34  brain]
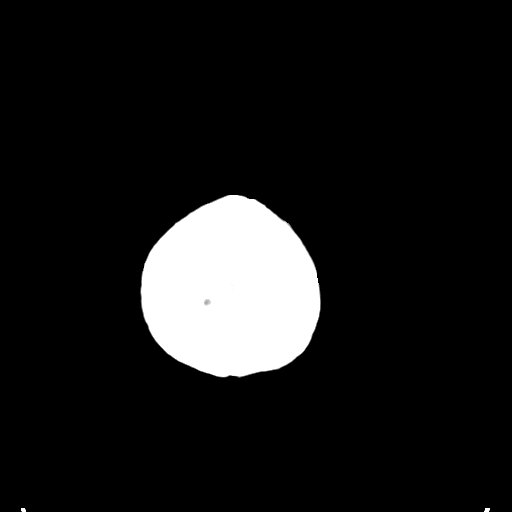
[im 31/34  bone]
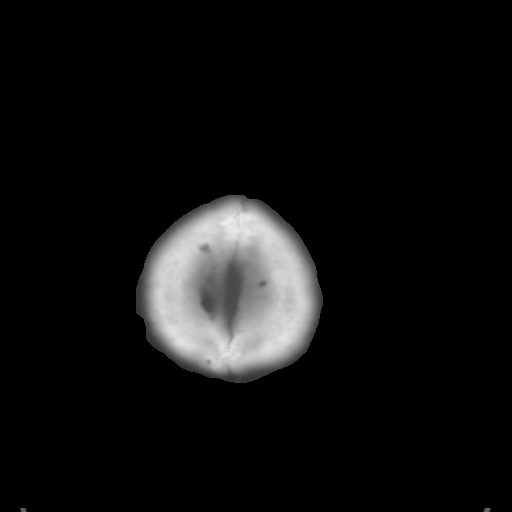

[Series 4: coronal soft tissue · coronal · 0.34mm/px · 3 of 71 slices shown]
[im 24/71  brain]
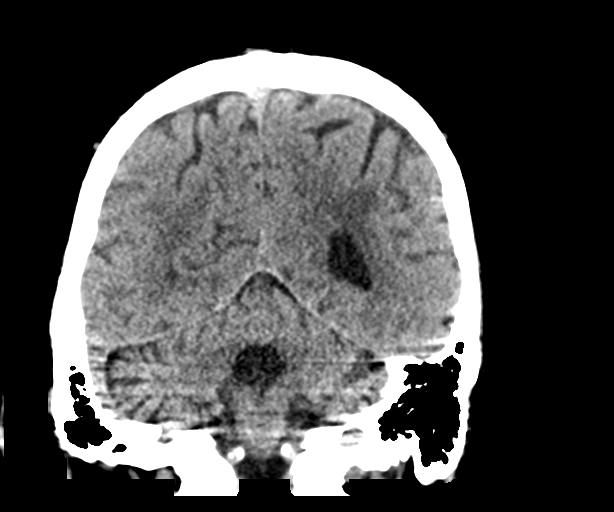
[im 32/71  brain]
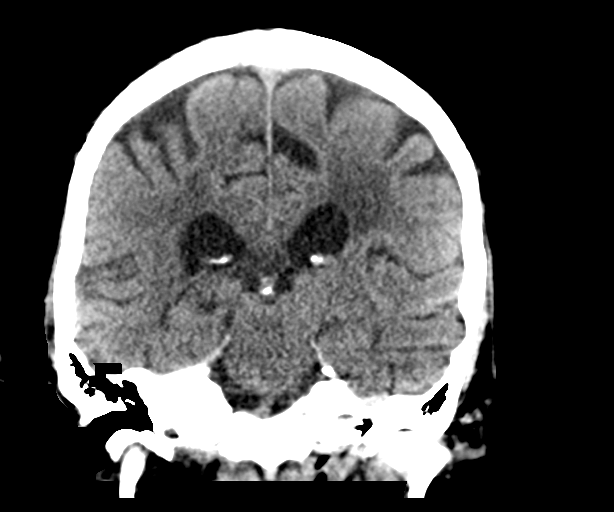
[im 39/71  brain]
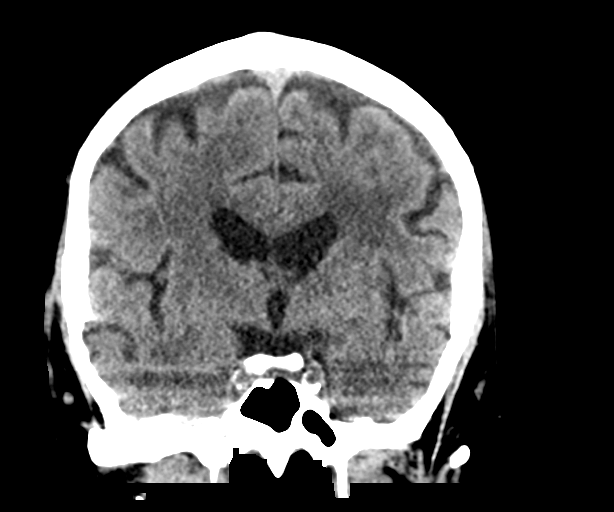

[Series 5: sagittal soft tissue · sagittal · 0.38mm/px · 3 of 57 slices shown]
[im 19/57  brain]
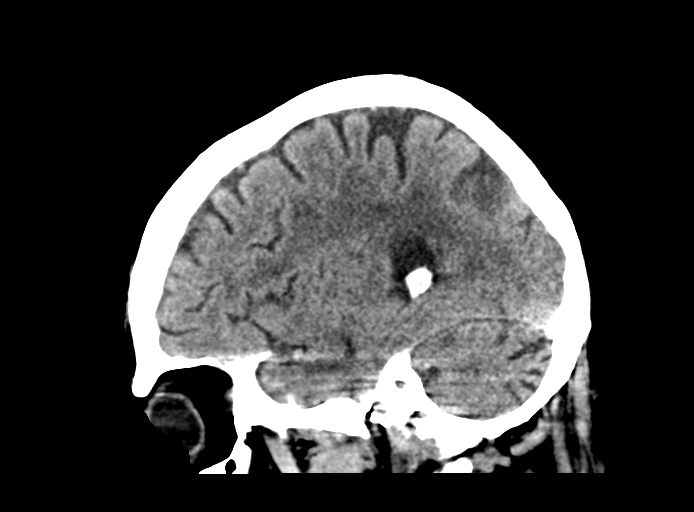
[im 29/57  brain]
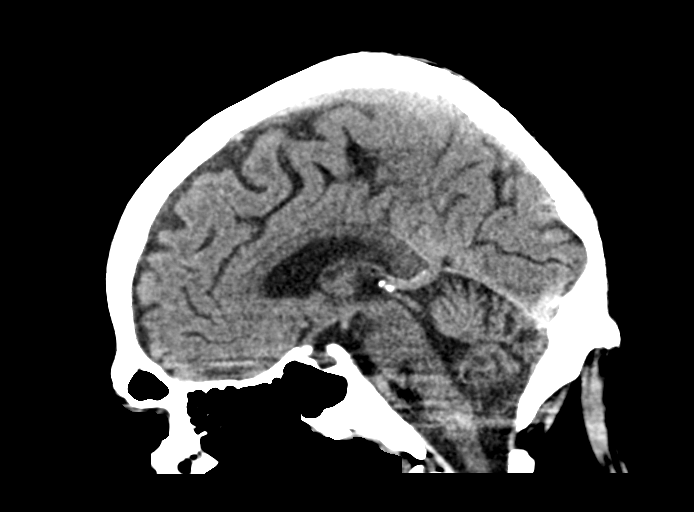
[im 38/57  brain]
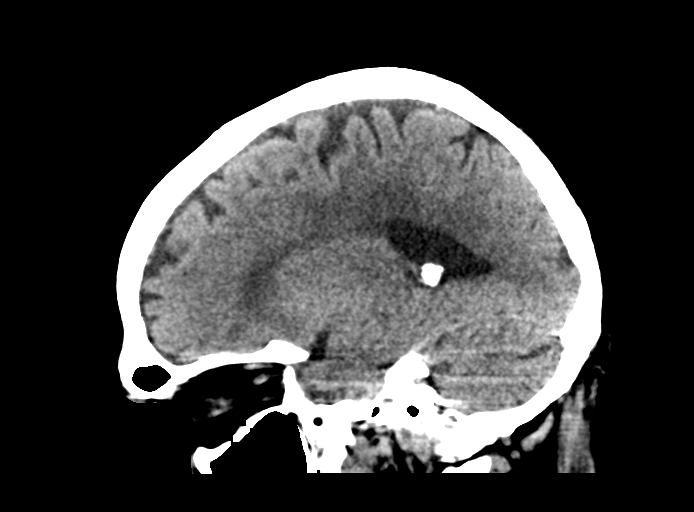

[15 of 47 positions shown; findings below may reference images not displayed]

FINDINGS: Brain: No acute cortically based infarct, intracranial hemorrhage,
mass, midline shift, or extra-axial fluid collection is identified.
Patchy hypodensities in the cerebral white matter bilaterally are
new and nonspecific but compatible with moderate chronic small
vessel ischemic disease. There is a chronic lacunar infarct in the
right basal ganglia. The ventricles and sulci are within normal
limits for age.

Vascular: Calcified atherosclerosis at the skull base. No hyperdense
vessel.

Skull: No fracture or suspicious osseous lesion.

Sinuses/Orbits: Visualized paranasal sinuses and mastoid air cells
are clear. Unremarkable orbits.

Other: None.
IMPRESSION: 1. No evidence of acute intracranial abnormality.
2. Moderate chronic small vessel ischemic disease, new from [id number].

## 2023-05-26 NOTE — Telephone Encounter (Signed)
Telephone call
